# Patient Record
Sex: Female | Born: 1935 | Race: White | Hispanic: No | Smoking: Never smoker
Health system: Southern US, Community
[De-identification: ages and names within clinical notes are randomized; demographics above are authoritative.]

## PROBLEM LIST (undated history)

## (undated) DIAGNOSIS — K219 Gastro-esophageal reflux disease without esophagitis: Secondary | ICD-10-CM

## (undated) DIAGNOSIS — J189 Pneumonia, unspecified organism: Secondary | ICD-10-CM

## (undated) DIAGNOSIS — R32 Unspecified urinary incontinence: Secondary | ICD-10-CM

## (undated) DIAGNOSIS — Z87442 Personal history of urinary calculi: Secondary | ICD-10-CM

## (undated) DIAGNOSIS — E079 Disorder of thyroid, unspecified: Secondary | ICD-10-CM

## (undated) DIAGNOSIS — I219 Acute myocardial infarction, unspecified: Secondary | ICD-10-CM

## (undated) DIAGNOSIS — I4891 Unspecified atrial fibrillation: Secondary | ICD-10-CM

## (undated) DIAGNOSIS — I214 Non-ST elevation (NSTEMI) myocardial infarction: Secondary | ICD-10-CM

## (undated) DIAGNOSIS — I10 Essential (primary) hypertension: Secondary | ICD-10-CM

## (undated) DIAGNOSIS — E785 Hyperlipidemia, unspecified: Secondary | ICD-10-CM

## (undated) DIAGNOSIS — M1990 Unspecified osteoarthritis, unspecified site: Secondary | ICD-10-CM

## (undated) DIAGNOSIS — K449 Diaphragmatic hernia without obstruction or gangrene: Secondary | ICD-10-CM

## (undated) HISTORY — DX: Diaphragmatic hernia without obstruction or gangrene: K44.9

## (undated) HISTORY — DX: Essential (primary) hypertension: I10

## (undated) HISTORY — PX: ESOPHAGOGASTRODUODENOSCOPY (EGD) WITH ESOPHAGEAL DILATION: SHX5812

## (undated) HISTORY — PX: ANKLE FRACTURE SURGERY: SHX122

## (undated) HISTORY — DX: Hyperlipidemia, unspecified: E78.5

## (undated) HISTORY — PX: UPPER GASTROINTESTINAL ENDOSCOPY: SHX188

## (undated) HISTORY — PX: JOINT REPLACEMENT: SHX530

## (undated) HISTORY — DX: Disorder of thyroid, unspecified: E07.9

## (undated) HISTORY — PX: TIBIA FRACTURE SURGERY: SHX806

## (undated) HISTORY — DX: Gastro-esophageal reflux disease without esophagitis: K21.9

## (undated) HISTORY — PX: FRACTURE SURGERY: SHX138

## (undated) HISTORY — PX: CATARACT EXTRACTION, BILATERAL: SHX1313

## (undated) HISTORY — DX: Unspecified osteoarthritis, unspecified site: M19.90

## (undated) HISTORY — PX: KIDNEY STONE SURGERY: SHX686

## (undated) HISTORY — PX: TOTAL HIP ARTHROPLASTY: SHX124

## (undated) HISTORY — PX: APPENDECTOMY: SHX54

## (undated) HISTORY — PX: TONSILLECTOMY: SUR1361

## (undated) HISTORY — PX: CHOLECYSTECTOMY: SHX55

---

## 2003-10-14 ENCOUNTER — Emergency Department (HOSPITAL_COMMUNITY): Admission: EM | Admit: 2003-10-14 | Discharge: 2003-10-14 | Payer: Self-pay | Admitting: Emergency Medicine

## 2006-08-25 ENCOUNTER — Ambulatory Visit: Payer: Self-pay | Admitting: Internal Medicine

## 2006-08-31 ENCOUNTER — Encounter: Admission: RE | Admit: 2006-08-31 | Discharge: 2006-08-31 | Payer: Self-pay | Admitting: Orthopedic Surgery

## 2006-09-08 ENCOUNTER — Inpatient Hospital Stay (HOSPITAL_COMMUNITY): Admission: RE | Admit: 2006-09-08 | Discharge: 2006-09-13 | Payer: Self-pay | Admitting: Orthopedic Surgery

## 2007-01-05 ENCOUNTER — Encounter: Admission: RE | Admit: 2007-01-05 | Discharge: 2007-01-05 | Payer: Self-pay | Admitting: General Surgery

## 2007-01-11 ENCOUNTER — Ambulatory Visit: Payer: Self-pay | Admitting: Internal Medicine

## 2007-01-18 ENCOUNTER — Ambulatory Visit: Payer: Self-pay | Admitting: Internal Medicine

## 2007-01-18 HISTORY — PX: COLONOSCOPY: SHX5424

## 2007-01-18 LAB — CONVERTED CEMR LAB
Basophils Absolute: 0.1 10*3/uL (ref 0.0–0.1)
Basophils Relative: 0.9 % (ref 0.0–1.0)
Eosinophils Absolute: 0 10*3/uL (ref 0.0–0.6)
Eosinophils Relative: 0.5 % (ref 0.0–5.0)
HCT: 37.1 % (ref 36.0–46.0)
Hemoglobin: 12.6 g/dL (ref 12.0–15.0)
Lymphocytes Relative: 25.7 % (ref 12.0–46.0)
MCHC: 34 g/dL (ref 30.0–36.0)
MCV: 83.3 fL (ref 78.0–100.0)
Monocytes Absolute: 0.4 10*3/uL (ref 0.2–0.7)
Monocytes Relative: 7.8 % (ref 3.0–11.0)
Neutro Abs: 3.7 10*3/uL (ref 1.4–7.7)
Neutrophils Relative %: 65.1 % (ref 43.0–77.0)
Platelets: 317 10*3/uL (ref 150–400)
RBC: 4.45 M/uL (ref 3.87–5.11)
RDW: 14 % (ref 11.5–14.6)
WBC: 5.6 10*3/uL (ref 4.5–10.5)

## 2007-03-16 ENCOUNTER — Ambulatory Visit (HOSPITAL_COMMUNITY): Admission: RE | Admit: 2007-03-16 | Discharge: 2007-03-17 | Payer: Self-pay | Admitting: General Surgery

## 2007-09-22 ENCOUNTER — Emergency Department (HOSPITAL_COMMUNITY): Admission: EM | Admit: 2007-09-22 | Discharge: 2007-09-23 | Payer: Self-pay | Admitting: Emergency Medicine

## 2010-01-18 ENCOUNTER — Ambulatory Visit: Payer: Self-pay | Admitting: Diagnostic Radiology

## 2010-01-18 ENCOUNTER — Emergency Department (HOSPITAL_BASED_OUTPATIENT_CLINIC_OR_DEPARTMENT_OTHER): Admission: EM | Admit: 2010-01-18 | Discharge: 2010-01-18 | Payer: Self-pay | Admitting: Emergency Medicine

## 2010-03-06 ENCOUNTER — Telehealth: Payer: Self-pay | Admitting: Internal Medicine

## 2010-03-07 ENCOUNTER — Ambulatory Visit: Payer: Self-pay | Admitting: Internal Medicine

## 2010-03-07 DIAGNOSIS — E785 Hyperlipidemia, unspecified: Secondary | ICD-10-CM

## 2010-03-07 DIAGNOSIS — I10 Essential (primary) hypertension: Secondary | ICD-10-CM

## 2010-03-12 ENCOUNTER — Ambulatory Visit: Payer: Self-pay | Admitting: Internal Medicine

## 2011-01-06 NOTE — Progress Notes (Signed)
Summary: TRIAGE--Dysphagia  Phone Note From Other Clinic Call back at [phone]   Caller: Patient Caller: Verlon Au, nurse   (ask for Debbie) Call For: Dr. Leone Payor Reason for Call: Schedule Patient Appt Summary of Call: pt having severe dysphagia... Initial call taken by: Vallarie Mare,  March 06, 2010 12:10 PM  Follow-up for Phone Call        Pt. will see Willette Cluster NP on 03-07-10 at 2pm. Eunice Blase will advise pt. of appt/med.[email or web address]. She will fax records to [phone]. Follow-up by: Laureen Ochs LPN,  March 06, 2010 12:18 PM

## 2011-01-06 NOTE — Procedures (Signed)
Summary: [facility] COLON   Colonoscopy  Procedure date:  01/18/2007  Findings:      Location:  [facility].   Patient Name: [name], [name]. MRN:  Procedure Procedures: Colonoscopy CPT: [phone].  Personnel: Endoscopist: Iva Boop, MD, [facility].  Exam Location: Exam performed in Outpatient Clinic. Outpatient  Patient Consent: Procedure, Alternatives, Risks and Benefits discussed, consent obtained, from patient. Consent was obtained by the RN.  Indications  Evaluation of: Anemia Positive fecal occult blood test  History  Current Medications: Patient is not currently taking Coumadin.  Allergies: No known allergies.  Pre-Exam Physical: Performed Jan 18, 2007. Cardio-pulmonary exam, Rectal exam, HEENT exam , Abdominal exam, Mental status exam WNL.  Comments: Pt. history reviewed/updated, physical exam performed prior to initiation of sedation? YES Exam Exam: Extent of exam reached: Cecum, extent intended: Cecum.  The cecum was identified by appendiceal orifice and IC valve. Patient position: on left side. Time to Cecum: 00:12:50. Time for Withdrawl: 00:09:48. Colon retroflexion performed. Images taken. ASA Classification: II. Tolerance: good.  Monitoring: Pulse and BP monitoring, Oximetry used. Supplemental O2 given.  Colon Prep Used MoviPrep for colon prep. Prep results: excellent.  Sedation Meds: Patient assessed and found to be appropriate for moderate (conscious) sedation. Residual sedation present from prior procedure today.  Versed 2 mg. given IV.  Findings - NORMAL EXAM: Cecum to Rectum. Comments: tortuous and redundant, skin tags in rectum.   Assessment  Comments: 1) NO POLYPS OR CANCER 2) NORMAL BUT TORTUOUS AND REDUNDANT COLON. NO SOURCE OF ANEMIA SEEN. 3) EXCELLENT PREP 4) HGB TODAY 12.6 WITH NORMAL MCV AND RDW Events  Unplanned Interventions: No intervention was required.  Plans Medication Plan: Continue current medications.  Patient  Education: Patient given standard instructions for: a normal exam.  Disposition: After procedure patient sent to recovery. After recovery patient sent home.  Scheduling/Referral: Follow-Up prn.  Comments: RETURN IF PERSISTENT DYSPHAGIA  cc: Consuello Bossier, MD     Billey Gosling Records, MD  This report was created from the original endoscopy report, which was reviewed and signed by the above listed endoscopist.

## 2011-01-06 NOTE — Miscellaneous (Signed)
Summary: lansoprazole rx  Clinical Lists Changes  Medications: Changed medication from PRILOSEC OTC 20 MG TBEC (OMEPRAZOLE MAGNESIUM) once daily to LANSOPRAZOLE 30 MG CPDR (LANSOPRAZOLE) 1 by mouth 30-60 minutes before breakfast for esophageal stricture - Signed Rx of LANSOPRAZOLE 30 MG CPDR (LANSOPRAZOLE) 1 by mouth 30-60 minutes before breakfast for esophageal stricture;  #30 x 11;  Signed;  Entered by: Iva Boop MD, [facility] Graham;  Authorized by: Iva Boop MD, [facility];  Method used: Electronically to [facility]*, [address] [place], [place]  [address], Ph: [phone], Fax: [phone]    Prescriptions: LANSOPRAZOLE 30 MG CPDR (LANSOPRAZOLE) 1 by mouth 30-60 minutes before breakfast for esophageal stricture  #30 x 11   Entered and Authorized by:   Iva Boop MD, [facility]   Signed by:   Iva Boop MD, [facility] on 03/12/2010   Method used:   Electronically to        [facility]* (retail)       [address] [place], [place]  [address]       Ph: [phone]       Fax: [phone]   RxID:   [id number]

## 2011-01-06 NOTE — Procedures (Signed)
Summary: [facility] EGD   EGD  Procedure date:  01/18/2007  Findings:      Location: [facility]   Patient Name: [name], [name]. MRN:  Procedure Procedures: Panendoscopy (EGD) CPT: 43235.    with [facility] Dilation of Esophagus Personnel: Endoscopist: Iva Boop, MD, [facility].  Exam Location: Exam performed in Outpatient Clinic. Outpatient  Patient Consent: Procedure, Alternatives, Risks and Benefits discussed, consent obtained, from patient. Consent was obtained by the RN.  Indications  Evaluation of: Anemia,   Symptoms: Dysphagia.  History  Current Medications: Patient is not currently taking Coumadin.  Allergies: No known allergies.  Pre-Exam Physical: Performed Jan 18, 2007  Cardio-pulmonary exam, HEENT exam, Abdominal exam, Mental status exam WNL.  Comments: Pt. history reviewed/updated, physical exam performed prior to initiation of sedation? YES Exam Exam Info: Maximum depth of insertion Duodenum, intended Duodenum. Patient position: on left side. Gastric retroflexion performed. Images taken. ASA Classification: II. Tolerance: excellent.  Sedation Meds: Patient assessed and found to be appropriate for moderate (conscious) sedation. Fentanyl 25 mcg. given IV. Versed 3 mg. given IV. Cetacaine Spray 2 sprays given aerosolized.  Monitoring: BP and pulse monitoring done. Oximetry used. Supplemental O2 given  Findings - Normal: Proximal Esophagus to Distal Esophagus.  STRICTURE / STENOSIS: Stricture in Distal Esophagus.  Constriction: partial. Etiology: benign due to reflux. Lumen diameter is 16 mm. ICD9: Esophageal Stricture: 530.3.  - Dilation: Distal Esophagus. for esophageal stricture. Maloney dilator used, Diameter: 54 F, Minimal Resistance, No Heme present on extraction. 1  total dilators used. Patient tolerance excellent.  HIATAL HERNIA: 3 cms. in length. ICD9: Hernia, Hiatal: 553.3. Normal: Cardia to Body.  - Normal: Duodenal Bulb to Duodenal  2nd Portion.  - MUCOSAL ABNORMALITY: Body to Antrum. Erythematous mucosa. Mottled mucosa. ICD9: Gastritis, Unspecified: 535.50.   Assessment  Diagnoses: 530.3: Esophageal Stricture.  553.3: Hernia, Hiatal.  535.50: Gastritis, Unspecified.   Comments: 1) BENIGN-APPEARING DISTAL ESOPHAGEAL STRICTURE DILATED TO 54 FR 2) SMALL HIATAL HERNIA 3) ANTRAL GASTRITIS Events  Unplanned Intervention: No unplanned interventions were required.  Plans Instructions: Clear or full liquids: UNTIL 5 PM THEN SOFT. Resume previous diet: TOMORROW.  Medication(s): Continue current medications.  Patient Education: Patient given standard instructions for: Stenosis / Stricture.  Disposition: After procedure patient sent to recovery. After recovery patient sent home.  Scheduling: Colonoscopy, NEXT    cc: Consuello Bossier, MD     Leonette Most Record, MD  This report was created from the original endoscopy report, which was reviewed and signed by the above listed endoscopist.

## 2011-01-06 NOTE — Letter (Signed)
Summary: EGD Instructions  [facility]  [address] [place], [place] [address]   Phone: [phone]  Fax: [phone]       [name]    01/30/1920    MRN: [id number]       Procedure Day /Date:03-12-10     Arrival Time: 1:30 PM      Procedure Time: 2:30 PM     Location of Procedure:                    X     [facility] (4th Floor)    PREPARATION FOR ENDOSCOPY   On 4-6-11THE DAY OF THE PROCEDURE:  1.   No solid foods, milk or milk products are allowed after midnight the night before your procedure.  2.   Do not drink anything colored red or purple.  Avoid juices with pulp.  No orange juice.  3.  You may drink clear liquids until as:12:30 PM , which is 2 hours before your procedure.                                                                                                CLEAR LIQUIDS INCLUDE: Water Jello Ice Popsicles Tea (sugar ok, no milk/cream) Powdered fruit flavored drinks Coffee (sugar ok, no milk/cream) Gatorade Juice: apple, white grape, white cranberry  Lemonade Clear bullion, consomm, broth Carbonated beverages (any kind) Strained chicken noodle soup Hard Candy   MEDICATION INSTRUCTIONS  Unless otherwise instructed, you should take regular prescription medications with a small sip of water as early as possible the morning of your procedure.       OTHER INSTRUCTIONS  You will need a responsible adult at least 75 years of age to accompany you and drive you home.   This person must remain in the waiting room during your procedure.  Wear loose fitting clothing that is easily removed.  Leave jewelry and other valuables at home.  However, you may wish to bring a book to read or an iPod/MP3 player to listen to music as you wait for your procedure to start.  Remove all body piercing jewelry and leave at home.  Total time from sign-in until discharge is approximately 2-3 hours.  You should go home directly after your  procedure and rest.  You can resume normal activities the day after your procedure.  The day of your procedure you should not:   Drive   Make legal decisions   Operate machinery   Drink alcohol   Return to work  You will receive specific instructions about eating, activities and medications before you leave.    The above instructions have been reviewed and explained to me by   _______________________    I fully understand and can verbalize these instructions _____________________________ Date _________

## 2011-01-06 NOTE — Procedures (Signed)
Summary: Upper Endoscopy w/DIL  Patient: [name] Note: All result statuses are Final unless otherwise noted.  Tests: (1) Upper Endoscopy w/DIL (UED)  UED Upper Endoscopy w/DIL                             DONE     [facility]     [address]     [place], [place]  [address]           ENDOSCOPY PROCEDURE REPORT           PATIENT:  [name], [name]  MR#:  [id number]     BIRTHDATE:  26-May-1920, 89 yrs. old  GENDER:  female           ENDOSCOPIST:  Iva Boop, MD, [facility]           PROCEDURE DATE:  03/12/2010     PROCEDURE:  EGD, diagnostic     ASA CLASS:  Class II     INDICATIONS:  1) dilation of esophageal stricture  2) dysphagia           MEDICATIONS:   Fentanyl 25 mcg IV, Versed 2 mg IV     TOPICAL ANESTHETIC:  Exactacain Spray           DESCRIPTION OF PROCEDURE:   After the risks benefits and     alternatives of the procedure were thoroughly explained, informed     consent was obtained.  The LB GIF-H180 [id number] endoscope was     introduced through the mouth and advanced to the second portion of     the duodenum, without limitations.  The instrument was slowly     withdrawn as the mucosa was carefully examined.     <<PROCEDUREIMAGES>>           A stricture was found in the distal esophagus. 15-16 mm diameter     ring-like stricture without inflammation  A hiatal hernia was     found. It was 3 cm in size.  The examination was otherwise normal.     Dilation was then performed at the distal esophagus           1) Dilator:  Elease Hashimoto  Size(s):  54 French     Resistance:  minimal  Heme:  none           COMPLICATIONS:  None           ENDOSCOPIC IMPRESSION:     1) Stricture in the distal esophagus - dilated to 54 French     2) 3 cm hiatal hernia     3) Otherwise normal examination.     RECOMMENDATIONS:     1) Clear liquids until 4 PM then soft foods. Try normal foods     tomorrow.     2) Start Lansoprazole 30 mg each AM before breakfast, prescription     sent to  pharmacy - THIS REPLACES PRILOSEC OTC           REPEAT EXAM:  In for as needed.           Iva Boop, MD, [facility] Graham           CC:  Leonette Most Record, M.D.     The Patient           n.     eSIGNED:   Iva Boop at 03/12/2010 02:50 PM           [place],  [place], [id number]  Note: An exclamation mark (!) indicates a result that was not dispersed into the flowsheet. Document Creation Date: 03/12/2010 2:51 PM _______________________________________________________________________  (1) Order result status: Final Collection or observation date-time: 03/12/2010 14:32 Requested date-time:  Receipt date-time:  Reported date-time:  Referring Physician:   Ordering Physician: Stan Head [phone]) Specimen Source:  Source: Launa Grill Order Number: [id number] Lab site:

## 2011-01-06 NOTE — Assessment & Plan Note (Signed)
Summary: WORSENING DYSPHAGIA        (DR.GESSNER PT.)    [name]   History of Present Illness Visit Type: Initial Consult Primary GI MD: Stan Head MD [facility] Primary Provider: Leonette Most Record, MD Requesting Provider: Leonette Most Record, MD Chief Complaint: Dysphagia x 2 weeks, History of Present Illness:   Patient last seen by [place] in Feb. 2008 at which time she had a colonoscoopy done for anemia.  Patient  has a history of esophageal stricture / dilation in 2007.  Did well for last few years until 3-4 weeks ago began having problems with swallowing meat. Beef most  problematic. Describes dysphagia as choking sensation immediately after swallowing beef. Feels like she then cannot catch her breath. No problems with liquids. No heartburn but gets occasional reflux.   Takes Aleve 1-3 tablets a weeks.    GI Review of Systems    Reports acid reflux, dysphagia with solids, and  nausea.      Denies abdominal pain, belching, bloating, chest pain, dysphagia with liquids, heartburn, loss of appetite, vomiting, vomiting blood, weight loss, and  weight gain.        Denies anal fissure, black tarry stools, change in bowel habit, constipation, diarrhea, diverticulosis, fecal incontinence, heme positive stool, hemorrhoids, irritable bowel syndrome, jaundice, light color stool, liver problems, rectal bleeding, and  rectal pain. Preventive Screening-Counseling & Management  Alcohol-Tobacco     Smoking Status: never      Drug Use:  no.      Current Medications (verified): 1)  Oxybutynin Chloride 5 Mg Tabs (Oxybutynin Chloride) .... Take 1 Tablet By Mouth Two Times A Day As Needed For Bladder Control 2)  Lisinopril 10 Mg Tabs (Lisinopril) .... Take 2 Tablets By Mouth  Every Morning & 1 Tablet Every Evening For Blood Preesure 3)  Amlodipine Besylate 2.5 Mg Tabs (Amlodipine Besylate) .... Take 1 Tablet By Mouth Once Daily 4)  Lipitor 10 Mg Tabs (Atorvastatin Calcium) .... Once Daily 5)  Prilosec Otc 20 Mg Tbec  (Omeprazole Magnesium) .... Once Daily 6)  Aspir-Low 81 Mg Tbec (Aspirin) .... Once Daily 7)  Calcium 600 1500 Mg Tabs (Calcium Carbonate) .... Once Daily 8)  Glucosamine-Chondroitin 500-400 Mg Caps (Glucosamine-Chondroitin) .... Once Daily  Allergies (verified): No Known Drug Allergies  Past History:  Past Medical History: Hypertension Esophageal Stricture Hyperlipidemia GERD  Past Surgical History: Cataract Extraction Cholecystectomy Hip Replacement Tonsillectomy  Family History: No FH of Colon Cancer: Family History of Diabetes: Brother Family History of Heart Disease: Father  Social History: Occupation: Retired Patient has never smoked.  Alcohol Use - no Daily Caffeine Use Illicit Drug Use - no Smoking Status:  never Drug Use:  no  Review of Systems       The patient complains of arthritis/joint pain, cough, hearing problems, muscle pains/cramps, urine leakage, and voice change.  The patient denies allergy/sinus, anemia, anxiety-new, back pain, blood in urine, breast changes/lumps, change in vision, confusion, coughing up blood, depression-new, fainting, fatigue, fever, headaches-new, heart rhythm changes, itching, menstrual pain, night sweats, nosebleeds, pregnancy symptoms, shortness of breath, skin rash, sleeping problems, sore throat, swelling of feet/legs, swollen lymph glands, thirst - excessive , urination - excessive , urination changes/pain, and vision changes.    Vital Signs:  Patient profile:   77 year old female Height:      65 inches Weight:      146 pounds Pulse rate:   64 / minute Pulse rhythm:   regular BP sitting:   150 / 60  (  left arm) Cuff size:   regular  Vitals Entered By: June McMurray CMA Duncan Dull) (March 07, 2010 1:24 PM)  Physical Exam  General:  Well developed, well nourished, no acute distress. Head:  Normocephalic and atraumatic. Eyes:  Conjunctiva pink, no icterus.  Mouth:  No oral lesions. Tongue moist.  Neck:  no obvious  masses  Lungs:  Clear throughout to auscultation. Heart:  Regular rate and rhythm; no murmurs, rubs,  or bruits. Abdomen:  Abdomen soft, nontender, nondistended. No obvious masses or hepatomegaly.Normal bowel sounds.  Msk:  Symmetrical with no gross deformities. Normal posture. Extremities:  No palmar erythema, no edema.  Neurologic:  Alert and  oriented x4;  grossly normal neurologically. Skin:  Intact without significant lesions or rashes. Cervical Nodes:  No significant cervical adenopathy. Psych:  Alert and cooperative. Normal mood and affect.   Impression & Recommendations:  Problem # 1:  DYSPHAGIA (VHQ-469.62) Assessment Deteriorated Soild food dysphagia, specifically with beef. History of distal esophageal stricture dilated three years ago. Rule out recurrent stricture. The patient will be scheduled for an EGD with biopsies/ esophageal dilation ( if indicated).  The risks and benefits of the procedure, as well as alternatives were discussed with the patient and she agrees to proceed. We discussed that in the meantime she should avoid beef, eat small bites and drink lots of fluid with meals.   Orders: EGD SAV (EGD SAV)  Problem # 2:  HYPERTENSION (ICD-401.9) Assessment: Comment Only Treated  Problem # 3:  HYPERLIPIDEMIA (ICD-272.4) Assessment: Comment Only Treated.  Patient Instructions: 1)  We have scheduled the Endoscopy with Dr. Leone Payor on 03-12-10. 2)  Endoscopy brochure given. 3)  Stop the Aleve Sat 03-08-10 until after the Endoscopy.  4)  Copy sent to : Leonette Most Record, MD 5)  The medication list was reviewed and reconciled.  All changed / newly prescribed medications were explained.  A complete medication list was provided to the patient / caregiver.

## 2011-02-26 LAB — URINALYSIS, ROUTINE W REFLEX MICROSCOPIC
Bilirubin Urine: NEGATIVE
Glucose, UA: NEGATIVE mg/dL
Ketones, ur: NEGATIVE mg/dL
Leukocytes, UA: NEGATIVE
Nitrite: NEGATIVE
Protein, ur: NEGATIVE mg/dL
Specific Gravity, Urine: 1.014 (ref 1.005–1.030)
Urobilinogen, UA: 0.2 mg/dL (ref 0.0–1.0)
pH: 7 (ref 5.0–8.0)

## 2011-02-26 LAB — COMPREHENSIVE METABOLIC PANEL
ALT: 17 U/L (ref 0–35)
AST: 22 U/L (ref 0–37)
Albumin: 3.7 g/dL (ref 3.5–5.2)
Alkaline Phosphatase: 72 U/L (ref 39–117)
BUN: 14 mg/dL (ref 6–23)
CO2: 30 mEq/L (ref 19–32)
Calcium: 9.3 mg/dL (ref 8.4–10.5)
Chloride: 106 mEq/L (ref 96–112)
Creatinine, Ser: 0.9 mg/dL (ref 0.4–1.2)
GFR calc Af Amer: >60 mL/min (ref >60)
GFR calc non Af Amer: 59 mL/min — ABNORMAL LOW (ref >60)
Glucose, Bld: 90 mg/dL (ref 70–99)
Potassium: 4.3 mEq/L (ref 3.5–5.1)
Sodium: 141 mEq/L (ref 135–145)
Total Bilirubin: 0.5 mg/dL (ref 0.3–1.2)
Total Protein: 6.8 g/dL (ref 6.0–8.3)

## 2011-02-26 LAB — URINE MICROSCOPIC-ADD ON

## 2011-02-26 LAB — DIFFERENTIAL
Basophils Absolute: 0 10*3/uL (ref 0.0–0.1)
Basophils Relative: 0 % (ref 0–1)
Eosinophils Absolute: 0.1 10*3/uL (ref 0.0–0.7)
Eosinophils Relative: 2 % (ref 0–5)
Lymphocytes Relative: 20 % (ref 12–46)
Lymphs Abs: 1.5 10*3/uL (ref 0.7–4.0)
Monocytes Absolute: 0.5 10*3/uL (ref 0.1–1.0)
Monocytes Relative: 6 % (ref 3–12)
Neutro Abs: 5.3 10*3/uL (ref 1.7–7.7)
Neutrophils Relative %: 71 % (ref 43–77)

## 2011-02-26 LAB — URINE CULTURE: Colony Count: 10000

## 2011-02-26 LAB — CBC
HCT: 38.1 % (ref 36.0–46.0)
Hemoglobin: 13.1 g/dL (ref 12.0–15.0)
MCHC: 34.5 g/dL (ref 30.0–36.0)
MCV: 87.4 fL (ref 78.0–100.0)
Platelets: 278 10*3/uL (ref 150–400)
RBC: 4.35 MIL/uL (ref 3.87–5.11)
RDW: 13 % (ref 11.5–15.5)
WBC: 7.4 10*3/uL (ref 4.0–10.5)

## 2011-02-26 LAB — LIPASE, BLOOD: Lipase: 63 U/L (ref 23–300)

## 2011-04-24 NOTE — Op Note (Signed)
[name], [name]                 ACCOUNT NO.:  [id number]   MEDICAL RECORD NO.:  [id number]          PATIENT TYPE:  INP   LOCATION:  0009                         FAC[facility]   PHYSICIAN:  Ollen Gross, M.D.    DATE OF BIRTH:  1920/09/05   DATE OF PROCEDURE:  09/08/2006  DATE OF DISCHARGE:                                 OPERATIVE REPORT   PREOPERATIVE DIAGNOSIS:  Osteoarthritis, right hip.   POSTOPERATIVE DIAGNOSIS:  Osteoarthritis, right hip.   PROCEDURE:  Right total hip arthroplasty.   SURGEON:  Ollen Gross, M.D.   ASSISTANT:  Avel Peace, M.D.   ANESTHESIA:  General.   ESTIMATED BLOOD LOSS:  350.   DRAINS:  Hemovac x1.   COMPLICATIONS:  None.   CONDITION:  Stable to recovery room.   CLINICAL NOTE:  [name] is an 77 year old female with severe right hip  pain, inability to walk with a rapidly progressive onset.  Radiographs show  a collapsed femoral head with severe end-stage osteoarthritis.  We have been  treating the knee arthritis for long time at the hip. It became acutely  painful and she had intractable pain.  She presents now for total hip  arthroplasty.   PROCEDURE IN DETAIL:  After successful administration of a general  anesthetic, the patient was placed in the left lateral decubitus position  with the right side up and held with the hip positioner.  The right lower  extremity is isolated from her perineum with plastic drapes and prepped and  draped in the usual sterile fashion.  A short posterolateral incision is  made with a 10 blade through the subcutaneous tissue to the level of the  fascia lata which is incised in line with the skin incision.  The sciatic  nerve is palpated and protected and the short rotators isolated off the  femur.  Capsulectomy was performed and the hip is dislocated.  The center of  the femoral head is marked and the trial prosthesis placed such that the  center of the trial head corresponds to the center of her  native femoral  head.  Osteotomy lines were marked on the femoral neck and osteotomy made  with an oscillating saw.  Femoral head is removed and then the femur  retracted anteriorly to gain acetabular exposure.   Acetabular reaming begins at 45 coursing in increments of 2 up to 53 mm and  then a 54 mm pinnacle acetabular shell is placed in anatomic position and  transfixed with two dome screws.  Trial 32 mm neutral +4 liner was placed.   The femur was prepared with the canal finder and irrigation.  Axial reaming  is performed to 13.5 mm, proximal reaming to 18 D and the sleeve machine to  a small.  An 43 D small trial sleeve is placed at 18 x 13 stem and a 36 plus  8 neck.  Her native anteversion is neutral so I dialed in 20 degrees of  anteversion.  I placed the 32 +0 head.  There was not enough offset to a 36  plus 12 neck.  Again we used 20 degrees of anteversion.  I tried the 32 +0  head first, but needed additional head size, so I went with the 32 plus 6.  The hips were reduced with outstanding stability, full extension, full  external rotation, 70 degrees flexion, 40 degrees adduction, 90 degrees  internal rotation and 90 degrees of flexion, 70 degrees of internal  rotation.  Cleared by placing the right leg on top of the left.  The leg  lengths were now equal.  The hip was then dislocated and all trials were  removed.  The permanent apex hole eliminator is placed into the acetabular  shell and then a permanent 32 mm neutral +4 liner was placed.  This is a  Museum/gallery curator.  On the femoral side we placed the 18 D small sleeve with  the 18 x 13 stem and a 36 plus 12 neck again 20 degrees beyond her native  anteversion.  The 32 plus 6 head is placed and the hip is reduced with the  same stability parameters.  The wound was copiously irrigated with saline  solution and the short rotators were reattached to the femur through drill  holes.  The fascia lata was closed over a Hemovac drain  with interrupted #1  Vicryl, the subcu closed with #1 and #2-0 Vicryl and subcuticular running 4-  0 Monocryl.  The incisions were cleaned and dried and Steri-Strips and a  bulky sterile dressing applied.  A drain is hooked to suction and she is  placed into a knee immobilizer, awakened, and transferred to recovery in  stable condition.      Ollen Gross, M.D.  Electronically Signed     FA/MEDQ  D:  09/08/2006  T:  09/10/2006  Job:  [id number]

## 2011-04-24 NOTE — Assessment & Plan Note (Signed)
HEALTHCARE                         GASTROENTEROLOGY OFFICE NOTE   NAME:Keatts, CHYLA SCHLENDER                        MRN:          [id number]  DATE:01/11/2007                            DOB:          December 22, 1919    CHIEF COMPLAINT:  Followup of dysphagia, anemia.   HISTORY:  Ms. Deford had her hip replaced after I saw her in the fall.  She was having a lot of early satiety and weight loss.  She was also  anemic with a heme-positive stool.  After her hip was replaced, she is  much better but she still has some early satiety and what sounds like  possible dysphagia.  She has had 2 previous dilations in [facility].  In addition, she feels hoarse a lot of times.  In the morning, she has a  phlegm that she needs to clear out of her throat.  She clearly indicates  she still gets filled fast and has some anorexia but thinks that is  better.  She is on Prilosec 40 mg twice daily.  Since I saw her, she  also developed problems with her right inguinal hernia.  Dr. Zachery Dakins  has evaluated her.  CT scan of the abdomen and pelvis on January 05, 2007, demonstrated a small, right inguinal hernia containing fat in the  portion of the appendix tip.  She had degenerative disk disease in the L-  spine.  She is feeling better.  She has gained 6 pounds since she had  her hip replaced by Dr. Lequita Halt.  She did have some blood loss anemia  associated with her hip as well and her hemoglobin fell to 8 at one  point.  She did receive transfusions in the hospital.   PAST MEDICAL HISTORY:  1. Right hip replacement.  2. Hypertension.  3. Bilateral knee osteoarthritis.  4. Lumbar spine degeneration.  5. Bladder dysfunction.  6. Dyslipidemia.  7. Hypothyroidism.  8. Appendectomy.  9. Cholecystectomy.   MEDICATIONS:  1. Lipitor 10 mg daily.  2. Oxybutynin 5 mg twice daily and 2 at bedtime.  3. Labetalol 50 mg twice daily.  4. Lisinopril 5 mg daily.  5. Aspirin 81 mg daily.  6. Ferrous sulfate daily.  7. Prilosec 40 mg twice daily.  8. Hydrocodone p.r.n.   DRUG ALLERGIES:  None known.   REVIEW OF SYSTEMS:  CONSTITUTIONAL:  Negative.  EYES:  Negative.  CARDIOVASCULAR:  Negative.  RESPIRATORY:  Negative.  ADDITIONAL GI:  She  has had more frequent bowel movements with no bleeding noted.   PHYSICAL EXAM:  Reveals an elderly, robust woman.  Weight 130 pounds.  Pulse is 68.  Blood pressure 120/70.  Height 5 feet  3 inches.  EYES:  Anicteric.  ABDOMEN:  Soft.  There is a small right inguinal hernia, easily  reproducible.  Abdomen is nontender without other organomegaly or mass.  NEURO:  She is alert and oriented x3.   ASSESSMENT:  1. Dysphagia, early satiety, history of weight loss that is improving.      Known history of esophageal stricture or at least esophageal  dilation.  2. Anemia.  Heme-positive stool.  3. Some change in bowel habits with more frequent stools.  4. Right inguinal hernia, this may be repaired by Dr. Zachery Dakins at      some point.   Note:  We will follow up her CBC today as well.   PLAN:  Schedule upper GI endoscopy with possible esophageal dilation.  Schedule colonoscopy.  We have elected to perform these at the same time  i.e. on the same day.  Risks, benefits, and indications are explained.  She understands and agrees to proceed.     Iva Boop, MD,FACG  Electronically Signed    CEG/MedQ  DD: 01/11/2007  DT: 01/11/2007  Job #: [id number]   cc:   Anselm Pancoast. Zachery Dakins, M.D.  Charles Record

## 2011-04-24 NOTE — Op Note (Signed)
NAMEALISSA, [name]                 ACCOUNT NO.:  [id number]   MEDICAL RECORD NO.:  [id number]          PATIENT TYPE:  AMB   LOCATION:  DAY                          FACILITY:  [facility]   PHYSICIAN:  Anselm Pancoast. Weatherly, M.D.DATE OF BIRTH:  Jun 04, 1920   DATE OF PROCEDURE:  03/16/2007  DATE OF DISCHARGE:                               OPERATIVE REPORT   PREOPERATIVE DIAGNOSES:  Right inguinal hernia.   POSTOPERATIVE DIAGNOSIS:  Right inguinal hernia.   OPERATIONS:  Right inguinal herniorrhaphy.   ANESTHESIA:  Local with sedation surgery.   SURGEON:  Anselm Pancoast. Zachery Dakins, M.D.   HISTORY:  [name] is an 77 year old female whom I first saw  approximately 3 months ago when she was in for a fairly recent right hip  replacement that had been done; and the patient, as she was recovering,  had a pain and bulge in the right groin.  This appeared to be anterior;  and she was referred by her medical physician for a right inguinal  hernia.  The patient had a history that was fairly complex, in that she  had lost about 50 pounds prior to hip replacement, was having  significant pain in the right hip area.  She had been seen by Dr.  Leone Payor who had found blood in her stool; and then had been scheduled  for an endoscopy procedure, but because of the pain she was having in  her hip; and the time came that she could get a hip replacement, then  she proceeded in having that done here at [facility] by Dr. Lequita Halt on  October 3.  She was on Coumadin for short period afterwards. She did  well from the surgery, went to stay with her son after she was released  from the rehab; and it was during this period of time that she got the  bulge.  When I saw her in the office; she did not have a definite bulge,  but she certainly gave a history that she had had swelling in the right  groin, not in the femoral area; and she had a lymph node that was  lateral to the inguinal incision area.  Since I could not  feel a  definite hernia, since she had lost weight and had an enlarged lymph  node; we worked her up as follows:  The first thing I did a CT and it  showed the enlarged lymph node in the right inguinal area but not any  periaortic or other areas.  I then talked with Dr. Leone Payor, but by this  time she had been taken off the Coumadin; and we proceeded on having an  upper and lower endoscopy with no evidence of any acute findings,  especially ruling out malignancy at her age of 89.   Since then, there has been a small amount of pain in the groin with  activity, but never actually seeing the bulge any more; and I have seen  her back in the office on two occasions; and feel that this is most  likely kind of a direct inguinal hernia; and  not, at no time, have I  ever felt a bulge in the femoral area on the right.  She is here, today,  for the planned procedure; and I am going to do it as local and  sedation.   Preop she was given a gram of Ancef.  The lymph node that had been  palpable previously, is no longer palpable; and I marked the right side.  I then positioned her on the OR table.  The right pubic hair area was  first clipped and then prepped with Betadine solution and draped in a  sterile manner.  The ilioinguinal nerve was anesthetized with a mixture  of 1/2% plain Xylocaine and 0.25 Marcaine with adrenalin; and then the  ilioinguinal nerve area was infiltrated with a blunted 22-gauge needle.  The patient positions the bulge anteriorly; and I could feel the  inguinal ligament area.  A small incision was made and sharp dissection  down through the skin and subcutaneous tissues.  She is very thin; and  then the external oblique aponeurosis was opened through the external  ring.  I then elevated the external oblique, and could see a little  fatty tissue that was kind of coming out through the internal ring area;  and I was impressed that it was not as big that she had certainly   complained of previously; and we sort of opened up the floor.  She  certainly does have a weakness, but it is the medial to the femoral  vein.  I elected to repair this, by taking the internal oblique  conjoined tendon area; and first bringing it down to Cooper's ligament  medially; and then switching to the shelving edge of the inguinal  ligament right at the femoral vein area; and then basically closing the  internal ring area.   The ilioinguinal nerve had been protected and brought out inferiorly;  and then at first I was wondering about whether to reinforce the area  with mesh, but I do not think that the defect is actually more of a  direct; and her tissues were such, that I think that it will have less  pain if we will go ahead and repair, without using a piece of mesh  between the internal and external oblique area.  As I am closing the  external oblique, laterally, I made sure that little ilioinguinal nerve  was inferior and not trapped in the sutures; and then in the medial  half, I  brought the nerve so it was external to the external oblique;  and then repaired the floor in the direct area, kind of incorporating  the external oblique with the internal oblique; and the conjoined tendon  area.  I had her strain numerous times during this procedure to see if I  could feel any other fascial defects etcetera, and could not.  She was  comfortable throughout the procedure.   The Scarpa's fascia was closed with interrupted 3-0 Vicryl.  I closed  the external oblique with the 2-0 Prolene as I had done the shoulder-  type repair more laterally.  The subcuticular 4-0 Vicryl and then  Benzoin and Steri-Strips on the skin.  The patient tolerated the  procedure nicely.  Hopefully, she will be able to void.  I used about 30  mL of the mixture of anesthetic solutions; and whether she goes home  today or tomorrow, we will let her decide with her age of 77.  ______________________________  Anselm Pancoast. Zachery Dakins, M.D.     WJW/MEDQ  D:  03/16/2007  T:  03/16/2007  Job:  [id number]

## 2011-04-24 NOTE — Discharge Summary (Signed)
[name], [name]                 ACCOUNT NO.:  [id number]   MEDICAL RECORD NO.:  [id number]          PATIENT TYPE:  INP   LOCATION:  1621                         FACILITY:  [facility]   PHYSICIAN:  Ollen Gross, M.D.    DATE OF BIRTH:  [birth year]   DATE OF ADMISSION:  09/08/2006  DATE OF DISCHARGE:  09/13/2006                                 DISCHARGE SUMMARY   ADMISSION DIAGNOSES:  1. Osteoarthritis right hip.  2. Hypertension.  3. Reflux disease.  4. History of esophageal stricture.  5. Urinary incontinence.   DISCHARGE DIAGNOSES:  1. Osteoarthritis right hip status post right total hip arthroplasty.  2. Acute blood loss anemia.  3. Status post transfusion without sequelae.  4. Postoperative hyponatremia improved.  5. Hypertension.  6. Reflux disease.  7. History of esophageal stricture.  8. Urinary incontinence.   PROCEDURE:  September 08, 2006 right total hip surgery by Dr. Lequita Halt assisted  by A. Perkins, P.A.C. Anesthesia general.   CONSULTATIONS:  None.   BRIEF HISTORY:  [name] is an 77 year old female with severe right hip  pain and inability to walk with rapid progression of the onset of arthritis,  collapse at the femoral head, intractable pain, who now presents for a total  hip arthroplasty.   LABORATORY DATA:  Preoperative CBC: hemoglobin 11.3, hematocrit 32.7, white  cell count 8000. Postoperative hemoglobin 9.6 dropped down to 8.1. He was  given blood and post transfusion hemoglobin was 10.7, hematocrit 30.0.  PT,  PTT preoperatively 13.7 and 32 respectively. INR of 1.10. Serial protimes  were followed, PT/INR 22.3 and 1.9. Chemistry panel on admission had a  mildly elevated glucose of 127, low total protein of 5.5, low albumin of  2.7, remaining chemistry panel within normal limits and were followed.  Glucose 127 to 215, back down to 134, sodium dropped to 139 and 131, back up  to 135. Preoperative urinalysis was greenish in color, otherwise negative.  Chemicals may have affected the color. Blood type A negative.   EKG dated September 07, 2006: Sinus bradycardia, left ventricular hypertrophy,  T wave abnormalities, confirmed by Dr. Nanetta Batty. A 2-view chest on  September 07, 2006: Old right rib fractures, no evidence of acute chest disease  radiographically. Hip films pre-op on September 07, 2006: Advanced severe right  hip arthritis. Portable pelvis on September 08, 2006 along with portable right  hip: Status post right total hip, no acute findings.   HOSPITAL COURSE:  The patient was admitted to [facility] and  tolerated the procedure well. Transferred to recovery and started on PCA and  stayed in PACU through the night. Had a so-so night. Later transferred to  the orthopedic floor after the night in recovery. A Hemovac drain was placed  and then was pulled. Started back on Prilosec. Foley was in place. She did  have some elevated serum glucose. Fluids were changed, D5 was removed. Once  she got up to the floor therapy was started. By day 2 she was doing better,  had a little bit better rest after a night in recovery.  Hemoglobin was down  to 9. Serum glucose had improved. Dressing was changed, incision looked  good. Started again on physical therapy. Ambulated about 30 feet then 50  feet that afternoon. Continued to progress well with therapy. By day 3 Foley  had been removed. Labs had gotten a little bit lower but she was  asymptomatic with the low hemoglobin of 8.4. We rechecked it the following  day but it had gotten a little bit lower down to 8.1. She was given 2 units  of packed cells. She felt much better after receiving the blood. Progressed  well. Ambulated approximately 90 feet. Continued to do well and by the  following day her hemoglobin was back up to 10. Felt better after the  transfusion, stronger, and was ready to go home.   DISCHARGE PLAN:  The patient was discharged home on September 13, 2006 with  diagnoses as noted  above.   DISCHARGE MEDICATIONS:  Vicodin, Robaxin, Coumadin and Keflex.   DIET:  Resume home diet.   FOLLOW UP:  In 2 weeks.   ACTIVITY:  Weight bearing as tolerated, total knee protocol. Home Health  nursing.   DISPOSITION:  Home.   CONDITION ON DISCHARGE:  Improved.      Alexzandrew L. Julien Girt, P.A.      Ollen Gross, M.D.  Electronically Signed    ALP/MEDQ  D:  10/07/2006  T:  10/07/2006  Job:  [id number]   cc:   Leonette Most Record  Fax: [phone]   Iva Boop, MD,FACG  [facility]  [address] [place], [place] [address]

## 2011-04-24 NOTE — H&P (Signed)
[name], [name]                 ACCOUNT NO.:  [id number]   MEDICAL RECORD NO.:  [id number]          PATIENT TYPE:  INP   LOCATION:  1621                         FACILITY:  [facility]   PHYSICIAN:  Ollen Gross, M.D.    DATE OF BIRTH:  [birth year]   DATE OF ADMISSION:  09/08/2006  DATE OF DISCHARGE:                                HISTORY & PHYSICAL   CHIEF COMPLAINT:  Right hip pain.   HISTORY OF PRESENT ILLNESS:  Patient is a 77 year old female, has been seen  by Dr. Lequita Halt for ongoing hip pain.  She has been treated in the past for  her knees.  Unfortunately, her right hip has been rapidly progressing in  severity over a short period of time.  She has had a major increase in pain,  especially over the past month.  X-rays taken by her primary care physician  in [place] showed severe arthritis.  She followed up with Dr. Lequita Halt  and found to have endstage arthritis with actually some subchondral collapse  that has caused marked increase in pain.  Due to the significant findings,  it is felt that she would benefit from undergoing surgical intervention.  Risks and benefits have been discussed.  Patient was subsequently admitted  to hospital.   ALLERGIES:  NO KNOWN DRUG ALLERGIES.   CURRENT MEDICATIONS:  Hydrocodone, Labetalol, Lipitor, Oxybutynin, AcipHex  and aspirin.   PAST MEDICAL HISTORY:  Hypertension, history of esophageal strictures,  reflux disease, urinary incontinence.   PAST SURGICAL HISTORY:  Tonsillectomy, cholecystectomy, recent EGD with  esophageal dilatation.   SOCIAL HISTORY:  Widowed, non-smoker, no alcohol, one child.   FAMILY HISTORY:  Father is deceased, age 55, with history of MI.  Mother  deceased, age 77.   REVIEW OF SYSTEMS:  GENERAL:  No fevers, chills, night sweats.  NEURO:  No  seizures, syncope or paralysis.  RESPIRATORY:  No shortness of breath,  productive cough or hemoptysis.  CARDIOVASCULAR:  No chest pain, angina or  orthopnea.  GI:  No  nausea, vomiting, diarrhea, constipation.  GU:  No  dysuria, hematuria, discharge.  MUSCULOSKELETAL:  Right hip.   PHYSICAL EXAMINATION:  VITAL SIGNS:  Pulse 56, respirations 12, blood  pressure 130/50.  GENERAL:  An 77 year old white female, petite frame, well-nourished, well-  developed, in no acute distress.  She is accompanied by her son.  She is  alert, oriented, cooperative, pleasant.  HEENT:  Normocephalic, atraumatic.  Pupils are round, reactive.  Oropharynx  clear.  EOMs intact.  She does have an upper partial denture plate.  CHEST:  Clear anterior, posterior chest walls.  No rhonchi, rales or  wheezing.  HEART:  Regular rate and rhythm.  No murmur, S1, S2.  ABDOMEN:  Soft, flat, nontender, bowel sounds present.  RECTAL, BREAST, GENITALIA:  Not done, not pertinent to present illness.  EXTREMITIES:  Right hip shows flexion of about 90 degrees to 0 internal and  0 external rotation, abduction of about 20 degrees.  Motor function is  intact.   IMPRESSION:  1. Osteoarthritis, right hip.  2. Hypertension.  3. Reflux  disease.  4. History of esophageal stricture.  5. Urinary incontinence.   PLAN:  Patient admitted to [facility] to undergo a right total hip  arthroplasty.  Surgery will be performed by Dr. Ollen Gross.      Alexzandrew L. Julien Girt, P.A.      Ollen Gross, M.D.  Electronically Signed    ALP/MEDQ  D:  09/08/2006  T:  09/09/2006  Job:  [id number]   cc:   Leonette Most Record  Fax: [phone]   Ollen Gross, M.D.  Fax: [phone]   Iva Boop, MD,FACG  [facility]  [address] [place], [place] [address]

## 2011-04-24 NOTE — Assessment & Plan Note (Signed)
[facility]                     [facility] GASTROENTEROLOGY OFFICE NOTE   NAME:[name], [name]                          MRN:          [id number]  DATE:08/25/2006                            DOB:          December 23, 1919    CHIEF COMPLAINT:  Weight loss, abdominal pain, and anemia.   REQUESTING PHYSICIAN:  Charles Record.   ASSESSMENT:  An 77 year old white woman who has had 20+ pound weight loss  over the last several months.  This is associated with anorexia.  She has  some aching in the left lower quadrant with a low-grade temperature to 99.8.  CT scan of the abdomen and pelvis has really been unrevealing except for  right hip problems, which I think is her biggest problem.  She has  excruciating pain due to collapse in the right hip joint.  She is hemoccult  positive today and has a mild anemia with a hemoglobin of 11.6 in August.   She had been on Arthrotec, so she certainly have gastritis or ulcer disease  causing her problems.  Colon cancer is in the differential as well.   RECOMMENDATIONS/PLANS:  1. Proceed to office visit with Dr. Lequita Halt tomorrow.  I think her right      hip is the biggest problem and may be responsible for some of her      anorexia issues, etc.  2. She needs an EGD and possible colonoscopy, depending upon the clinical      course.  She is not really a good candidate for her colonoscopy due to      her comorbidities and problems right now and certainly would be a      higher risk of problems from complications.  An upper GI endoscopy      could be done and may be needed.  3. I have given the patient and her son a note to take to Dr. Lequita Halt for      guidance regarding whether he feels he needs further GI workup prior to      any intervention for her hip.  We will follow up with the patient and      Dr. Despina Hick on this.  A telephone call will be made.   HISTORY:  A pleasant 77 year old white woman with problems as above.  She  has been having some anorexia and nausea.  No vomiting.  Has lost about 20  pounds or so since February. She describes aching in the left lower quadrant  and was prescribed Cipro and Flagyl empirically on August 20, 2006.  She  feels about the same since then.  She cannot give me a clear answer as to  whether Vicodin is helping more than the Arthrotec she was on.  It is very  difficult for her to walk.  She has extreme right hip pain and right lower  quadrant discomfort.  She does not describe diarrhea, rectal bleeding,  hematochezia, or melena.  She has been on iron for a month or so, it sounds  like, and her stools have been dark.   X-rays of the right hip on August 20, 2006  demonstrate significant  progression of advanced degenerative changes there, flattening of the  weightbearing surface, consistent with cortical fracture, and multiple other  changes.  A CT of the abdomen and pelvis has shown her to be status post  cholecystectomy.  She has a left renal cyst, calcified uterine fibroid, some  fecal retention, the degenerative changes in the right hip, and extensive  atherosclerotic disease in the vessels of the abdomen.  There is a history  of dysphagia and what sounds like an esophageal stricture dilated in [facility] about four years ago.  She does not have active dysphagia at this  time.  She had been on Prilosec and was switched to Nexium.   MEDICATIONS:  1. Nexium 40 mg daily.  2. Lipitor 10 mg daily.  3. Oxybutynin 5 mg twice daily and 10 mg at bedtime.  4. Labetalol 50 mg twice daily.  5. Lisinopril 5 mg daily.  6. Aspirin 81 mg daily.  7. Ferrous sulfate 325 mg daily.  8. Hydrocodone/APAP 5/500 t.i.d. p.r.n.  9. Cipro 500 mg b.i.d.  10.Flagyl 500 mg t.i.d.   DRUG ALLERGIES:  None known.   PAST MEDICAL HISTORY:  1. Hypertension.  2. Degenerative changes in the hip.  3. Bilateral knee problems with arthritis, treated by Dr. Despina Hick with      synovial fluid  injections over time.  4. Bladder dysfunction.  5. Dyslipidemia.  6. History of hypothyroidism.  7. Prior appendectomy.  8. Prior cholecystectomy.   FAMILY HISTORY:  Positive for heart disease in a father, diabetes in a  brother.  No colon cancer.   SOCIAL HISTORY:  She is widowed.  She lives alone in [place].  Previously had gotten much of her care in [place], but her son who is here  with her today and other children live in [place], and she is directing  more care there.  No alcohol, tobacco or drugs.   REVIEW OF SYSTEMS:  See my history and physical form for full details.   PHYSICAL EXAMINATION:  GENERAL:  A well-developed elderly white woman  looking somewhat frail.  VITAL SIGNS:  Height 5 feet 4, weight 125 pounds.  Blood pressure 126/68,  pulse 69.  HEENT:  Eyes anicteric.  Normal mouth and pharynx.  NECK:  Supple without mass.  CHEST:  Clear except for a few bibasilar crackles that improved with  respiration.  HEART:  S1 and S2.  No murmurs, rubs or gallops.  No jugular venous  distention.  ABDOMEN:  She is tender in the right lower quadrant without organomegaly or  mass.  RECTAL:  In the presence of female nursing staff, shows hemoccult positive  iron-colored stool.  No mass.  EXTREMITIES:  No edema in the lower extremities.  SKIN:  Some areas of senile purpura.  NEURO:  She is alert and oriented x3.  MUSCULOSKELETAL:  The right hip is tender with any range of motion.  She has  limited range of motion there, needing significant support with a cane or  assistance when she tries to weight bear.   I appreciate the opportunity to care for this patient.                                   Iva Boop, MD,FACG   CEG/MedQ  DD:  08/25/2006  DT:  08/26/2006  Job #:  [id number]   cc:   Leonette Most Record  Ollen Gross,  M.D. 

## 2011-09-16 LAB — CK TOTAL AND CKMB (NOT AT ARMC): Total CK: 69

## 2011-09-16 LAB — I-STAT 8, (EC8 V) (CONVERTED LAB)
Acid-Base Excess: 2
BUN: 14
Bicarbonate: 26.3 — ABNORMAL HIGH
Chloride: 103
Glucose, Bld: 113 — ABNORMAL HIGH
HCT: 40
Hemoglobin: 13.6
Operator id: 282201
Potassium: 4.4
Sodium: 136
TCO2: 28
pCO2, Ven: 39.5 — ABNORMAL LOW
pH, Ven: 7.432 — ABNORMAL HIGH

## 2011-09-16 LAB — URINALYSIS, ROUTINE W REFLEX MICROSCOPIC
Bilirubin Urine: NEGATIVE
Glucose, UA: NEGATIVE
Hgb urine dipstick: NEGATIVE
Ketones, ur: NEGATIVE
Nitrite: NEGATIVE
Protein, ur: NEGATIVE
Specific Gravity, Urine: 1.008
Urobilinogen, UA: 0.2
pH: 7.5

## 2011-09-16 LAB — DIFFERENTIAL
Basophils Absolute: 0
Basophils Relative: 1
Eosinophils Absolute: 0.2
Monocytes Relative: 8
Neutrophils Relative %: 56

## 2011-09-16 LAB — CBC
HCT: 35.7 — ABNORMAL LOW
Hemoglobin: 12.1
MCHC: 33.9
MCV: 85.9
Platelets: 298
RBC: 4.16
RDW: 13.6
WBC: 8

## 2011-09-16 LAB — URINE MICROSCOPIC-ADD ON

## 2011-09-16 LAB — POCT CARDIAC MARKERS
CKMB, poc: 1.4
Myoglobin, poc: 81.8
Myoglobin, poc: 92.3
Operator id: 282201
Troponin i, poc: <0.05

## 2011-09-16 LAB — POCT I-STAT CREATININE
Creatinine, Ser: 1.1
Operator id: 282201

## 2011-11-23 ENCOUNTER — Ambulatory Visit (INDEPENDENT_AMBULATORY_CARE_PROVIDER_SITE_OTHER): Payer: Medicare Other | Admitting: Internal Medicine

## 2011-11-23 ENCOUNTER — Encounter: Payer: Self-pay | Admitting: Internal Medicine

## 2011-11-23 VITALS — BP 136/58 | HR 64 | Ht 63.0 in | Wt 134.4 lb

## 2011-11-23 DIAGNOSIS — R1314 Dysphagia, pharyngoesophageal phase: Secondary | ICD-10-CM

## 2011-11-23 DIAGNOSIS — R1310 Dysphagia, unspecified: Secondary | ICD-10-CM

## 2011-11-23 DIAGNOSIS — K222 Esophageal obstruction: Secondary | ICD-10-CM

## 2011-11-23 DIAGNOSIS — K219 Gastro-esophageal reflux disease without esophagitis: Secondary | ICD-10-CM

## 2011-11-23 NOTE — Assessment & Plan Note (Signed)
She is now a recurrent symptomatic esophageal stricture with dysphagia, I believe. Her heartburn symptoms appear under control. She could have a component of dysmotility. At this point we'll plan for repeat upper endoscopy with likely esophageal dilation which she has tolerated and benefited from in the past.The risks and benefits as well as alternatives of endoscopic procedure(s) have been discussed and reviewed. All questions answered. The patient agrees to proceed.

## 2011-11-23 NOTE — Progress Notes (Signed)
  Subjective:    Patient ID: Lonia Blood, female    DOB: 1920/07/09, 77 y.o.   MRN: [id number]  HPI 77 yo ww with history of GERD and esophageal stricture. Last dilated about 1.5 years ago and did well until past 1-2 months with solid food dysphagia to oatmeal, cornbread and meat. Denies heartburn, bleeding or weight loss. Similar to prior problems. She is compliant with PPI. No Known Allergies Outpatient Prescriptions Prior to Visit  Medication Sig Dispense Refill  . amLODipine (NORVASC) 2.5 MG tablet Take 2.5 mg by mouth daily.        Marland Kitchen aspirin EC 81 MG tablet Take 81 mg by mouth daily.        . Calcium Carbonate-Vitamin D (SUPER CALCIUM 600 + D 400 PO) Take 1 tablet by mouth 2 (two) times daily.        . Cholecalciferol 2000 UNITS CAPS Take 1 capsule by mouth daily.        Marland Kitchen co-enzyme Q-10 30 MG capsule Take 30 mg by mouth daily.        Marland Kitchen lisinopril (PRINIVIL,ZESTRIL) 10 MG tablet Take 10 mg by mouth 2 (two) times daily.        Marland Kitchen omeprazole (PRILOSEC) 40 MG capsule Take 40 mg by mouth daily.        Marland Kitchen oxybutynin (DITROPAN-XL) 5 MG 24 hr tablet Take 1-2 tablets by mouth twice daily as needed       . Plant Sterols and Stanols 450 MG TABS Take 1 tablet by mouth 2 (two) times daily.        Marland Kitchen pyridoxine (B-6) 100 MG tablet Take 100 mg by mouth daily.         Past Medical History  Diagnosis Date  . HTN (hypertension)   . GERD (gastroesophageal reflux disease)   . S/P dilatation of esophageal stricture   . HLD (hyperlipidemia)   . Hiatal hernia   . Thyroid disorder   . Arthritis    Past Surgical History  Procedure Date  . Cataract extraction   . Cholecystectomy   . Total hip arthroplasty   . Tonsillectomy   . Colonoscopy 01/18/2007    tortuous or redundant colon  . Upper gastrointestinal endoscopy 03/12/2010    esophageal stricture, hiatal hernia  . Appendectomy    History   Social History  . Marital Status: Widowed    Spouse Name: N/A    Number of Children: 1  . Years of  Education: N/A   Occupational History  . Retired    Social History Main Topics  . Smoking status: Never Smoker   . Smokeless tobacco: Never Used  . Alcohol Use: No  . Drug Use: No  . Sexually Active: None   Other Topics Concern  . None   Social History Narrative  . None   Family History  Problem Relation Age of Onset  . Diabetes Brother   . Heart disease Father         Review of Systems As above    Objective:   Physical Exam General:  NAD Eyes:   anicteric Lungs:  clear Heart:  S1S2 no rubs, murmurs or gallops             Assessment & Plan:

## 2011-11-23 NOTE — Patient Instructions (Signed)
You have been scheduled for an Endoscopy with separate instructions given.  

## 2011-11-26 ENCOUNTER — Ambulatory Visit (AMBULATORY_SURGERY_CENTER): Payer: Medicare Other | Admitting: Internal Medicine

## 2011-11-26 ENCOUNTER — Encounter: Payer: Self-pay | Admitting: Internal Medicine

## 2011-11-26 VITALS — BP 185/67 | HR 52 | Temp 97.0°F | Resp 18 | Ht 63.0 in | Wt 134.0 lb

## 2011-11-26 DIAGNOSIS — K449 Diaphragmatic hernia without obstruction or gangrene: Secondary | ICD-10-CM

## 2011-11-26 DIAGNOSIS — K219 Gastro-esophageal reflux disease without esophagitis: Secondary | ICD-10-CM

## 2011-11-26 DIAGNOSIS — K225 Diverticulum of esophagus, acquired: Secondary | ICD-10-CM

## 2011-11-26 DIAGNOSIS — R1314 Dysphagia, pharyngoesophageal phase: Secondary | ICD-10-CM

## 2011-11-26 DIAGNOSIS — K222 Esophageal obstruction: Secondary | ICD-10-CM

## 2011-11-26 MED ORDER — SODIUM CHLORIDE 0.9 % IV SOLN: 500.0000 mL | INTRAVENOUS | Status: DC

## 2011-11-26 NOTE — Op Note (Signed)
[facility] [address] [place], [place]  [address]  ENDOSCOPY PROCEDURE REPORT  PATIENT:  Taler, Kushner  MR#:  [id number] BIRTHDATE:  1920-08-19, 89 yrs. old  GENDER:  female  ENDOSCOPIST:  Iva Boop, MD, [facility]  PROCEDURE DATE:  11/26/2011 PROCEDURE:  EGD with balloon dilatation and biopsy ASA CLASS:  Class II INDICATIONS:  1) dysphagia  2) dilation of esophageal stricture  MEDICATIONS:   These medications were titrated to patient response per physician's verbal order, Fentanyl 25 mcg IV, Versed 2 mg IV TOPICAL ANESTHETIC:  Cetacaine Spray  DESCRIPTION OF PROCEDURE:   After the risks benefits and alternatives of the procedure were thoroughly explained, informed consent was obtained.  The LB-GIF H180 [id number] endoscope was introduced through the mouth and advanced to the second portion of the duodenum, without limitations.  The instrument was slowly withdrawn as the mucosa was carefully examined. <<PROCEDUREIMAGES>>  A stricture was found in the distal esophagus. Ring-like stricture at 35 cm, angulated GE junction.  A hiatal hernia was found. It was 3 cm in size. 35-38 cm.  Erythema was found in the antrum. Streaky.  The examination was otherwise normal.    Dilation was then performed at the distal esophagus  1) Dilator:  Balloon  Size(s):  18,19,20 mm Resistance:  minimal  Heme:  none Appearance:  no effect seen There was some ecchymosis effect but no tear so biopsy forceps disruption technique with tunnel biopsies was also used.  COMPLICATIONS:  None  ENDOSCOPIC IMPRESSION: 1) Stricture in the distal esophagus - dilated to 20 mm and disrupted with forceps 2) 3 cm hiatal hernia 3) Erythema in the antrum 4) Otherwise normal examination. RECOMMENDATIONS: post-dilation diet follow-up as needed if swallowing difficulty not resolved stay on PPI  Iva Boop, MD, [facility]  CC:  Leonette Most Record, MD and The Patient  n. eSIGNED:   Iva Boop at  11/26/2011 02:52 PM  Dollene Cleveland, [phone]

## 2011-11-26 NOTE — Progress Notes (Signed)
Patient did not have preoperative order for IV antibiotic SSI prophylaxis. (G8918)  Patient did not experience any of the following events: a burn prior to discharge; a fall within the facility; wrong site/side/patient/procedure/implant event; or a hospital transfer or hospital admission upon discharge from the facility. (G8907)  

## 2011-11-26 NOTE — Patient Instructions (Signed)
There was a narrow area where the esophagus and stomach join - called a stricture. I stretched it and used a biopsy tool to try to open or dilate the area so your swallowing is better. Please follow the diet instructions the RN gives you. If you have more swallowing problems call us back and let my nurse know. Iva Boop, MD, [facility]

## 2011-11-27 ENCOUNTER — Telehealth: Payer: Self-pay | Admitting: *Deleted

## 2011-11-27 NOTE — Telephone Encounter (Signed)
Left message on number given in admitting yest. ewm 

## 2013-10-01 ENCOUNTER — Emergency Department (HOSPITAL_BASED_OUTPATIENT_CLINIC_OR_DEPARTMENT_OTHER)
Admission: EM | Admit: 2013-10-01 | Discharge: 2013-10-01 | Disposition: A | Discharge status: Home / Self Care | Payer: Medicare Other | Attending: Emergency Medicine | Admitting: Emergency Medicine

## 2013-10-01 ENCOUNTER — Encounter (HOSPITAL_BASED_OUTPATIENT_CLINIC_OR_DEPARTMENT_OTHER): Payer: Self-pay | Admitting: Emergency Medicine

## 2013-10-01 ENCOUNTER — Emergency Department (HOSPITAL_BASED_OUTPATIENT_CLINIC_OR_DEPARTMENT_OTHER): Payer: Medicare Other

## 2013-10-01 DIAGNOSIS — K219 Gastro-esophageal reflux disease without esophagitis: Secondary | ICD-10-CM | POA: Insufficient documentation

## 2013-10-01 DIAGNOSIS — Z79899 Other long term (current) drug therapy: Secondary | ICD-10-CM | POA: Insufficient documentation

## 2013-10-01 DIAGNOSIS — Y9301 Activity, walking, marching and hiking: Secondary | ICD-10-CM | POA: Insufficient documentation

## 2013-10-01 DIAGNOSIS — S0081XA Abrasion of other part of head, initial encounter: Secondary | ICD-10-CM

## 2013-10-01 DIAGNOSIS — S81009A Unspecified open wound, unspecified knee, initial encounter: Secondary | ICD-10-CM | POA: Insufficient documentation

## 2013-10-01 DIAGNOSIS — S0990XA Unspecified injury of head, initial encounter: Secondary | ICD-10-CM

## 2013-10-01 DIAGNOSIS — W010XXA Fall on same level from slipping, tripping and stumbling without subsequent striking against object, initial encounter: Secondary | ICD-10-CM | POA: Insufficient documentation

## 2013-10-01 DIAGNOSIS — Z7982 Long term (current) use of aspirin: Secondary | ICD-10-CM | POA: Insufficient documentation

## 2013-10-01 DIAGNOSIS — M129 Arthropathy, unspecified: Secondary | ICD-10-CM | POA: Insufficient documentation

## 2013-10-01 DIAGNOSIS — S41009A Unspecified open wound of unspecified shoulder, initial encounter: Secondary | ICD-10-CM | POA: Insufficient documentation

## 2013-10-01 DIAGNOSIS — Z9889 Other specified postprocedural states: Secondary | ICD-10-CM | POA: Insufficient documentation

## 2013-10-01 DIAGNOSIS — S0180XA Unspecified open wound of other part of head, initial encounter: Secondary | ICD-10-CM | POA: Insufficient documentation

## 2013-10-01 DIAGNOSIS — I10 Essential (primary) hypertension: Secondary | ICD-10-CM | POA: Insufficient documentation

## 2013-10-01 DIAGNOSIS — S8002XA Contusion of left knee, initial encounter: Secondary | ICD-10-CM

## 2013-10-01 DIAGNOSIS — Y9289 Other specified places as the place of occurrence of the external cause: Secondary | ICD-10-CM | POA: Insufficient documentation

## 2013-10-01 DIAGNOSIS — Z23 Encounter for immunization: Secondary | ICD-10-CM | POA: Insufficient documentation

## 2013-10-01 DIAGNOSIS — T148XXA Other injury of unspecified body region, initial encounter: Secondary | ICD-10-CM

## 2013-10-01 DIAGNOSIS — I4891 Unspecified atrial fibrillation: Secondary | ICD-10-CM | POA: Insufficient documentation

## 2013-10-01 DIAGNOSIS — W19XXXA Unspecified fall, initial encounter: Secondary | ICD-10-CM

## 2013-10-01 DIAGNOSIS — E079 Disorder of thyroid, unspecified: Secondary | ICD-10-CM | POA: Insufficient documentation

## 2013-10-01 DIAGNOSIS — IMO0002 Reserved for concepts with insufficient information to code with codable children: Secondary | ICD-10-CM | POA: Insufficient documentation

## 2013-10-01 HISTORY — DX: Unspecified atrial fibrillation: I48.91

## 2013-10-01 MED ORDER — CEPHALEXIN 500 MG PO CAPS: 500.0000 mg | ORAL_CAPSULE | Freq: Four times a day (QID) | ORAL | Status: DC

## 2013-10-01 MED ORDER — HYDROCODONE-ACETAMINOPHEN 5-325 MG PO TABS
1.0000 | ORAL_TABLET | Freq: Once | ORAL | Status: AC
  Administered 2013-10-01: 1 via ORAL
  Filled 2013-10-01: qty 1

## 2013-10-01 MED ORDER — HYDROCODONE-ACETAMINOPHEN 5-325 MG PO TABS: 2.0000 | ORAL_TABLET | ORAL | Status: DC | PRN

## 2013-10-01 MED ORDER — TETANUS-DIPHTHERIA TOXOIDS TD 5-2 LFU IM INJ
0.5000 mL | INJECTION | Freq: Once | INTRAMUSCULAR | Status: AC
  Administered 2013-10-01: 0.5 mL via INTRAMUSCULAR
  Filled 2013-10-01: qty 0.5

## 2013-10-01 NOTE — ED Notes (Signed)
Patient here after falling this am out on back porch. Patient reports that the porch is cement covered in carpet. Unsure why she fell, no loc. Skin tears to right arm, face and left knee

## 2013-10-01 NOTE — ED Notes (Signed)
Patient transported to CT 

## 2013-10-01 NOTE — ED Provider Notes (Signed)
CSN: [id number]     Arrival date & time 10/01/13  6295 History   First MD Initiated Contact with Patient 10/01/13 1010     Chief Complaint  Patient presents with  . Fall   (Consider location/radiation/quality/duration/timing/severity/associated sxs/prior Treatment) HPI Comments: Patient is a 79 year old female past medical history of hypertension, hiatal hernia, arthritis. She presents to the ER after a fall. She was walking out of her back porch when she tripped and fell. She injured her left knee, cause multiple skin tears to the right upper extremity, and also struck her for head on the ground. She denies headache or neck pain. She denies any chest pain, shortness of breath, or abdominal pain. She has been ambulatory since this fall however her knee appears to be "tightening up" on her.  Patient is a 79 y.o. female presenting with fall. The history is provided by the patient.  Fall This is a new problem. The current episode started less than 1 hour ago. The problem occurs constantly. The problem has not changed since onset.Pertinent negatives include no chest pain, no abdominal pain, no headaches and no shortness of breath. Nothing aggravates the symptoms. Nothing relieves the symptoms. She has tried nothing for the symptoms. The treatment provided no relief.    Past Medical History  Diagnosis Date  . HTN (hypertension)   . GERD (gastroesophageal reflux disease)   . S/P dilatation of esophageal stricture   . HLD (hyperlipidemia)   . Hiatal hernia   . Thyroid disorder   . Arthritis    Past Surgical History  Procedure Laterality Date  . Cataract extraction    . Cholecystectomy    . Total hip arthroplasty    . Tonsillectomy    . Colonoscopy  01/18/2007    tortuous or redundant colon  . Upper gastrointestinal endoscopy  03/12/2010, 11/26/2011    esophageal stricture dilations, hiatal hernia  . Appendectomy     Family History  Problem Relation Age of Onset  . Diabetes Brother   .  Heart disease Father   . Colon cancer Neg Hx   . Esophageal cancer Neg Hx   . Stomach cancer Neg Hx    History  Substance Use Topics  . Smoking status: Never Smoker   . Smokeless tobacco: Never Used  . Alcohol Use: No   OB History   Grav Para Term Preterm Abortions TAB SAB Ect Mult Living                 Review of Systems  Respiratory: Negative for shortness of breath.   Cardiovascular: Negative for chest pain.  Gastrointestinal: Negative for abdominal pain.  Neurological: Negative for headaches.  All other systems reviewed and are negative.    Allergies  Review of patient's allergies indicates no known allergies.  Home Medications   Current Outpatient Rx  Name  Route  Sig  Dispense  Refill  . amLODipine (NORVASC) 2.5 MG tablet   Oral   Take 2.5 mg by mouth daily.           Marland Kitchen aspirin EC 81 MG tablet   Oral   Take 81 mg by mouth daily.           . Calcium Carbonate-Vitamin D (SUPER CALCIUM 600 + D 400 PO)   Oral   Take 1 tablet by mouth 2 (two) times daily.           . Cholecalciferol 2000 UNITS CAPS   Oral   Take 1 capsule by mouth  daily.           . co-enzyme Q-10 30 MG capsule   Oral   Take 30 mg by mouth daily.           Marland Kitchen lisinopril (PRINIVIL,ZESTRIL) 10 MG tablet   Oral   Take 10 mg by mouth 2 (two) times daily.           Marland Kitchen omeprazole (PRILOSEC) 40 MG capsule   Oral   Take 40 mg by mouth daily.           Marland Kitchen oxybutynin (DITROPAN-XL) 5 MG 24 hr tablet      Take 1-2 tablets by mouth twice daily as needed          . Plant Sterols and Stanols 450 MG TABS   Oral   Take 1 tablet by mouth 2 (two) times daily.           Marland Kitchen pyridoxine (B-6) 100 MG tablet   Oral   Take 100 mg by mouth daily.            There were no vitals taken for this visit. Physical Exam  Nursing note and vitals reviewed. Constitutional: She is oriented to person, place, and time. She appears well-developed and well-nourished. No distress.  Patient is a  79 year old female. She is awake alert and appropriate.  HENT:  Head: Normocephalic and atraumatic.  Mouth/Throat: Oropharynx is clear and moist.  Eyes: EOM are normal. Pupils are equal, round, and reactive to light.  There is no diplopia on upward gaze.  Neck: Normal range of motion. Neck supple.  There is no cervical spine tenderness to palpation and no step-offs. She has painless range of motion in all directions.  Cardiovascular: Normal rate and regular rhythm.  Exam reveals no gallop and no friction rub.   No murmur heard. Pulmonary/Chest: Effort normal and breath sounds normal. No respiratory distress. She has no wheezes.  Abdominal: Soft. Bowel sounds are normal. She exhibits no distension. There is no tenderness.  Musculoskeletal: Normal range of motion.  The right upper extremity is noted to have multiple superficial skin tears, however no deep lacerations. These extend from just below the shoulder to the mid forearm. Distal pulses motor and sensory are intact.  The left knee has an abrasion on the anterior aspect of the patella. There is mild to moderate swelling surrounding this area. She has good range of motion without crepitus.  Lymphadenopathy:    She has no cervical adenopathy.  Neurological: She is alert and oriented to person, place, and time. No cranial nerve deficit. She exhibits normal muscle tone. Coordination normal.  Skin: Skin is warm and dry. She is not diaphoretic.    ED Course  Procedures (including critical care time) Labs Review Labs Reviewed - No data to display Imaging Review No results found.  EKG Interpretation   None       MDM  No diagnosis found. Patient presents here after a fall with extensive skin tears on the right upper extremity. There is nothing that is repairable with sutures this will require wound care. The daughter is a Engineer, civil (consulting) and she agrees to help look after her. CT of the head is negative and x-ray of the left knee reveals no  evidence for fracture. She is able to ambulate on the knee I do not feel as though there is an emergent process. If she is not improving she will require followup with her primary care Dr. to discuss.  She will  be discharged to home with instructions for local wound care. I will also treat with Keflex due to the number of skin tear she has sustained.    Geoffery Lyons, MD 10/01/13 1134

## 2015-03-31 ENCOUNTER — Emergency Department (HOSPITAL_BASED_OUTPATIENT_CLINIC_OR_DEPARTMENT_OTHER): Payer: Medicare Other

## 2015-03-31 ENCOUNTER — Inpatient Hospital Stay (HOSPITAL_BASED_OUTPATIENT_CLINIC_OR_DEPARTMENT_OTHER)
Admission: EM | Admit: 2015-03-31 | Discharge: 2015-04-03 | DRG: 200 | Disposition: A | Discharge status: Skilled Nursing Facility | Payer: Medicare Other | Attending: Surgery | Admitting: Surgery

## 2015-03-31 ENCOUNTER — Encounter (HOSPITAL_BASED_OUTPATIENT_CLINIC_OR_DEPARTMENT_OTHER): Payer: Self-pay | Admitting: *Deleted

## 2015-03-31 DIAGNOSIS — S270XXA Traumatic pneumothorax, initial encounter: Secondary | ICD-10-CM | POA: Diagnosis not present

## 2015-03-31 DIAGNOSIS — Z79899 Other long term (current) drug therapy: Secondary | ICD-10-CM

## 2015-03-31 DIAGNOSIS — W19XXXA Unspecified fall, initial encounter: Secondary | ICD-10-CM | POA: Diagnosis present

## 2015-03-31 DIAGNOSIS — K219 Gastro-esophageal reflux disease without esophagitis: Secondary | ICD-10-CM | POA: Diagnosis present

## 2015-03-31 DIAGNOSIS — S2241XA Multiple fractures of ribs, right side, initial encounter for closed fracture: Secondary | ICD-10-CM | POA: Diagnosis present

## 2015-03-31 DIAGNOSIS — J939 Pneumothorax, unspecified: Secondary | ICD-10-CM | POA: Diagnosis not present

## 2015-03-31 DIAGNOSIS — S2249XA Multiple fractures of ribs, unspecified side, initial encounter for closed fracture: Secondary | ICD-10-CM | POA: Diagnosis present

## 2015-03-31 DIAGNOSIS — E785 Hyperlipidemia, unspecified: Secondary | ICD-10-CM | POA: Diagnosis present

## 2015-03-31 DIAGNOSIS — D62 Acute posthemorrhagic anemia: Secondary | ICD-10-CM | POA: Diagnosis not present

## 2015-03-31 DIAGNOSIS — S2231XA Fracture of one rib, right side, initial encounter for closed fracture: Secondary | ICD-10-CM

## 2015-03-31 DIAGNOSIS — I10 Essential (primary) hypertension: Secondary | ICD-10-CM | POA: Diagnosis present

## 2015-03-31 DIAGNOSIS — I4891 Unspecified atrial fibrillation: Secondary | ICD-10-CM | POA: Diagnosis present

## 2015-03-31 DIAGNOSIS — Z7982 Long term (current) use of aspirin: Secondary | ICD-10-CM

## 2015-03-31 DIAGNOSIS — S2231XS Fracture of one rib, right side, sequela: Secondary | ICD-10-CM

## 2015-03-31 DIAGNOSIS — Z96649 Presence of unspecified artificial hip joint: Secondary | ICD-10-CM | POA: Diagnosis present

## 2015-03-31 LAB — CBC WITH DIFFERENTIAL/PLATELET
BASOS PCT: 0 % (ref 0–1)
Basophils Absolute: 0 10*3/uL (ref 0.0–0.1)
EOS ABS: 0 10*3/uL (ref 0.0–0.7)
EOS PCT: 0 % (ref 0–5)
HEMATOCRIT: 38.4 % (ref 36.0–46.0)
HEMOGLOBIN: 12.7 g/dL (ref 12.0–15.0)
LYMPHS ABS: 0.9 10*3/uL (ref 0.7–4.0)
LYMPHS PCT: 6 % — AB (ref 12–46)
MCH: 28.5 pg (ref 26.0–34.0)
MCHC: 33.1 g/dL (ref 30.0–36.0)
MCV: 86.3 fL (ref 78.0–100.0)
Monocytes Absolute: 0.6 10*3/uL (ref 0.1–1.0)
Monocytes Relative: 4 % (ref 3–12)
NEUTROS ABS: 13.8 10*3/uL — AB (ref 1.7–7.7)
NEUTROS PCT: 90 % — AB (ref 43–77)
Platelets: 255 10*3/uL (ref 150–400)
RBC: 4.45 MIL/uL (ref 3.87–5.11)
RDW: 14.7 % (ref 11.5–15.5)
WBC: 15.3 10*3/uL — ABNORMAL HIGH (ref 4.0–10.5)

## 2015-03-31 LAB — BASIC METABOLIC PANEL
Anion gap: 9 (ref 5–15)
BUN: 18 mg/dL (ref 6–23)
CALCIUM: 9.2 mg/dL (ref 8.4–10.5)
CHLORIDE: 106 mmol/L (ref 96–112)
CO2: 24 mmol/L (ref 19–32)
CREATININE: 0.88 mg/dL (ref 0.50–1.10)
GFR calc Af Amer: 64 mL/min — ABNORMAL LOW (ref >90)
GFR, EST NON AFRICAN AMERICAN: 55 mL/min — AB (ref >90)
Glucose, Bld: 189 mg/dL — ABNORMAL HIGH (ref 70–99)
POTASSIUM: 4.2 mmol/L (ref 3.5–5.1)
Sodium: 139 mmol/L (ref 135–145)

## 2015-03-31 MED ORDER — MORPHINE SULFATE 2 MG/ML IJ SOLN
2.0000 mg | Freq: Once | INTRAMUSCULAR | Status: AC
  Administered 2015-03-31: 2 mg via INTRAVENOUS
  Filled 2015-03-31: qty 1

## 2015-03-31 MED ORDER — ONDANSETRON HCL 4 MG/2ML IJ SOLN
4.0000 mg | Freq: Once | INTRAMUSCULAR | Status: AC
  Administered 2015-03-31: 4 mg via INTRAVENOUS
  Filled 2015-03-31: qty 2

## 2015-03-31 NOTE — ED Notes (Signed)
Pt fell in ditch on side of road while taking garbage out, she did not hit her head or have any loc.  She struck her right ribs and right shoulder when she fell and she states that when she raised her right arm after the fall she felt her shoulder pop back into place.  She has right rib and right shoulder pain now. No sob

## 2015-03-31 NOTE — ED Provider Notes (Signed)
CSN: [id number]     Arrival date & time 03/31/15  1926 History   First MD Initiated Contact with Patient 03/31/15 2148     Chief Complaint  Patient presents with  . Fall     (Consider location/radiation/quality/duration/timing/severity/associated sxs/prior Treatment) Patient is a 81 y.o. female presenting with fall. The history is provided by the patient.  Fall This is a new (Patient was taking the garbage out and lost her balance falling in the ditch on the right side. She initially had severe pain in her right shoulder and ribs. While moving her shoulder she heard a huge pop and it went back into place. ) problem. The current episode started 1 to 2 hours ago. The problem occurs constantly. The problem has not changed since onset.Associated symptoms include chest pain. Pertinent negatives include no abdominal pain, no headaches and no shortness of breath. Associated symptoms comments: Patient continues to have right rib pain. She denies any shortness of breath. No head injury, LOC. She denies abdominal pain, nausea or vomiting. Only painful when she moves. She denies back pain was able to ambulate after the event. Patient does have a history of atrial fibrillation intermittently but takes no anticoagulation.. The symptoms are aggravated by twisting and bending. Nothing relieves the symptoms. She has tried nothing for the symptoms. The treatment provided no relief.    Past Medical History  Diagnosis Date  . HTN (hypertension)   . GERD (gastroesophageal reflux disease)   . S/P dilatation of esophageal stricture   . HLD (hyperlipidemia)   . Hiatal hernia   . Thyroid disorder   . Arthritis   . Atrial fibrillation    Past Surgical History  Procedure Laterality Date  . Cataract extraction    . Cholecystectomy    . Total hip arthroplasty    . Tonsillectomy    . Colonoscopy  01/18/2007    tortuous or redundant colon  . Upper gastrointestinal endoscopy  03/12/2010, 11/26/2011    esophageal  stricture dilations, hiatal hernia  . Appendectomy     Family History  Problem Relation Age of Onset  . Diabetes Brother   . Heart disease Father   . Colon cancer Neg Hx   . Esophageal cancer Neg Hx   . Stomach cancer Neg Hx    History  Substance Use Topics  . Smoking status: Never Smoker   . Smokeless tobacco: Never Used  . Alcohol Use: No   OB History    No data available     Review of Systems  Respiratory: Negative for shortness of breath.   Cardiovascular: Positive for chest pain.  Gastrointestinal: Negative for abdominal pain.  Neurological: Negative for headaches.  All other systems reviewed and are negative.     Allergies  Review of patient's allergies indicates no known allergies.  Home Medications   Prior to Admission medications   Medication Sig Start Date End Date Taking? Authorizing Provider  amLODipine (NORVASC) 2.5 MG tablet Take 2.5 mg by mouth daily.      Historical Provider, MD  aspirin EC 81 MG tablet Take 81 mg by mouth daily.      Historical Provider, MD  Calcium Carbonate-Vitamin D (SUPER CALCIUM 600 + D 400 PO) Take 1 tablet by mouth 2 (two) times daily.      Historical Provider, MD  cephALEXin (KEFLEX) 500 MG capsule Take 1 capsule (500 mg total) by mouth 4 (four) times daily. 10/01/13   Geoffery Lyons, MD  Cholecalciferol 2000 UNITS CAPS Take 1 capsule  by mouth daily.      Historical Provider, MD  co-enzyme Q-10 30 MG capsule Take 30 mg by mouth daily.      Historical Provider, MD  HYDROcodone-acetaminophen (NORCO) 5-325 MG per tablet Take 2 tablets by mouth every 4 (four) hours as needed for pain. 10/01/13   Geoffery Lyons, MD  lisinopril (PRINIVIL,ZESTRIL) 10 MG tablet Take 10 mg by mouth 2 (two) times daily.      Historical Provider, MD  omeprazole (PRILOSEC) 40 MG capsule Take 40 mg by mouth daily.      Historical Provider, MD  oxybutynin (DITROPAN-XL) 5 MG 24 hr tablet Take 1-2 tablets by mouth twice daily as needed     Historical Provider, MD   Plant Sterols and Stanols 450 MG TABS Take 1 tablet by mouth 2 (two) times daily.      Historical Provider, MD  pyridoxine (B-6) 100 MG tablet Take 100 mg by mouth daily.      Historical Provider, MD   BP 141/59 mmHg  Pulse 56  Temp(Src) 98 F (36.7 C) (Oral)  Resp 18  Ht  (1.6 m)  Wt 128 lb (58.06 kg)  BMI 22.68 kg/m2  SpO2 99% Physical Exam  Constitutional: She is oriented to person, place, and time. She appears well-developed and well-nourished. No distress.  HENT:  Head: Normocephalic and atraumatic.  Mouth/Throat: Oropharynx is clear and moist.  Eyes: Conjunctivae and EOM are normal. Pupils are equal, round, and reactive to light.  Neck: Normal range of motion. Neck supple. No spinous process tenderness and no muscular tenderness present.  Cardiovascular: Normal rate, regular rhythm and intact distal pulses.   No murmur heard. Pulmonary/Chest: Effort normal. No tachypnea. No respiratory distress. She has decreased breath sounds. She has no wheezes. She has no rales. She exhibits tenderness.  Abdominal: Soft. She exhibits no distension. There is no tenderness. There is no rebound and no guarding.  Musculoskeletal: Normal range of motion. She exhibits no edema or tenderness.       Right shoulder: Normal.       Right hip: Normal.       Left hip: Normal.  Neurological: She is alert and oriented to person, place, and time.  Skin: Skin is warm and dry. No rash noted. No erythema.  Psychiatric: She has a normal mood and affect. Her behavior is normal.  Nursing note and vitals reviewed.   ED Course  Procedures (including critical care time) Labs Review Labs Reviewed  CBC WITH DIFFERENTIAL/PLATELET - Abnormal; Notable for the following:    WBC 15.3 (*)    Neutrophils Relative % 90 (*)    Neutro Abs 13.8 (*)    Lymphocytes Relative 6 (*)    All other components within normal limits  BASIC METABOLIC PANEL - Abnormal; Notable for the following:    Glucose, Bld 189 (*)     GFR calc non Af Amer 55 (*)    GFR calc Af Amer 64 (*)    All other components within normal limits    Imaging Review Dg Ribs Unilateral W/chest Right  03/31/2015   CLINICAL DATA:  Status post fall. Hit right ribs and right shoulder, with right rib and shoulder pain. Initial encounter.  EXAM: RIGHT RIBS AND CHEST - 3+ VIEW  COMPARISON:  Chest radiograph performed 09/07/2006  FINDINGS: There are displaced fractures of the right lateral third through seventh ribs, with associated small right apical pneumothorax. Underlying chronic right-sided rib deformities are also seen. Mild left basilar atelectasis  or scarring is noted.  The cardiomediastinal silhouette is borderline normal in size. The right shoulder is grossly unremarkable in appearance. Clips are noted overlying the right upper quadrant.  IMPRESSION: Displaced fractures of the right lateral third through seventh ribs, with associated small right apical pneumothorax. Mild left basilar atelectasis or scarring noted.  These results were called by telephone at the time of interpretation on 03/31/2015 at 9:25 pm to Dr. Gwyneth Sprout, who verbally acknowledged these results.   Electronically Signed   By: Roanna Raider M.D.   On: 03/31/2015 21:25   Dg Shoulder Right  03/31/2015   CLINICAL DATA:  81 year old female with a history of fall  EXAM: RIGHT SHOULDER - 2+ VIEW  COMPARISON:  09/07/2006  FINDINGS: Osteopenia.  Sequential rib fractures involving ribs , incompletely imaged on the shoulder plain film. There is associated right pneumothorax.  No evidence of acute fracture of the proximal right humerus. Glenohumeral joint appears congruent. No clavicle fracture identified. No scapular fracture identified.  IMPRESSION: No acute fracture of the right shoulder identified.  There are sequential fractures of the right third through tenth ribs. This is incompletely imaged on the current study.  Right-sided pneumothorax.  These results were called by telephone  at the time of interpretation on 03/31/2015 at 9:26 pm to Dr. Gwyneth Sprout , who verbally acknowledged these results.  Signed,  Yvone Neu. Loreta Ave, DO  [facility]  [facility]   Electronically Signed   By: Gilmer Mor D.O.   On: 03/31/2015 21:28     EKG Interpretation   Date/Time:  Sunday March 31 2015 22:30:46 EDT Ventricular Rate:  53 PR Interval:  168 QRS Duration: 94 QT Interval:  450 QTC Calculation: 422 R Axis:   66 Text Interpretation:  Sinus bradycardia Left ventricular hypertrophy with  repolarization abnormality Cannot rule out Septal infarct , age  undetermined , new T wave inversion Inferior leads Confirmed by Anitra Lauth   MD, Alphonzo Lemmings ([id number]) on 03/31/2015 11:10:36 PM      MDM   Final diagnoses:  Rib fractures, right, closed, initial encounter  Pneumothorax    Patient with a mechanical fall today when she was taking out the garbage. She landed on her right side and ribs with severe pain. Initially sounds like she most likely had a shoulder dislocation however she was attempting to range her shoulder popped back in at home. She continued to have rib pain supplement for evaluation. She denies any head injury, LOC or syncope.  No abdominal pain and she was able to ambulate without difficulty. X-rays here show 7 right-sided rib fractures with an associated small pneumothorax.   She is currently not tachypnea can satting 99%. She does not M. Uncomfortable unless she attempts to move.  We'll discuss patient with trauma surgery and transferred to [facility].    Gwyneth Sprout, MD 03/31/15 [phone]

## 2015-03-31 NOTE — Progress Notes (Signed)
Placed patient on 4 liter nasal cannula with humidity per MD.

## 2015-04-01 ENCOUNTER — Emergency Department (HOSPITAL_COMMUNITY): Payer: Medicare Other

## 2015-04-01 ENCOUNTER — Encounter (HOSPITAL_COMMUNITY): Payer: Self-pay | Admitting: Radiology

## 2015-04-01 ENCOUNTER — Inpatient Hospital Stay (HOSPITAL_COMMUNITY): Payer: Medicare Other

## 2015-04-01 DIAGNOSIS — S2249XA Multiple fractures of ribs, unspecified side, initial encounter for closed fracture: Secondary | ICD-10-CM | POA: Diagnosis present

## 2015-04-01 DIAGNOSIS — S2241XA Multiple fractures of ribs, right side, initial encounter for closed fracture: Secondary | ICD-10-CM | POA: Diagnosis present

## 2015-04-01 DIAGNOSIS — Z7982 Long term (current) use of aspirin: Secondary | ICD-10-CM | POA: Diagnosis not present

## 2015-04-01 DIAGNOSIS — S270XXA Traumatic pneumothorax, initial encounter: Secondary | ICD-10-CM | POA: Diagnosis present

## 2015-04-01 DIAGNOSIS — K219 Gastro-esophageal reflux disease without esophagitis: Secondary | ICD-10-CM | POA: Diagnosis present

## 2015-04-01 DIAGNOSIS — I10 Essential (primary) hypertension: Secondary | ICD-10-CM | POA: Diagnosis present

## 2015-04-01 DIAGNOSIS — I4891 Unspecified atrial fibrillation: Secondary | ICD-10-CM | POA: Diagnosis present

## 2015-04-01 DIAGNOSIS — W19XXXA Unspecified fall, initial encounter: Secondary | ICD-10-CM | POA: Diagnosis present

## 2015-04-01 DIAGNOSIS — E785 Hyperlipidemia, unspecified: Secondary | ICD-10-CM | POA: Diagnosis present

## 2015-04-01 DIAGNOSIS — Z79899 Other long term (current) drug therapy: Secondary | ICD-10-CM | POA: Diagnosis not present

## 2015-04-01 DIAGNOSIS — Z96649 Presence of unspecified artificial hip joint: Secondary | ICD-10-CM | POA: Diagnosis present

## 2015-04-01 DIAGNOSIS — J939 Pneumothorax, unspecified: Secondary | ICD-10-CM | POA: Diagnosis present

## 2015-04-01 DIAGNOSIS — D62 Acute posthemorrhagic anemia: Secondary | ICD-10-CM | POA: Diagnosis present

## 2015-04-01 LAB — CBC
HCT: 34.7 % — ABNORMAL LOW (ref 36.0–46.0)
Hemoglobin: 11.4 g/dL — ABNORMAL LOW (ref 12.0–15.0)
MCH: 27.8 pg (ref 26.0–34.0)
MCHC: 32.9 g/dL (ref 30.0–36.0)
MCV: 84.6 fL (ref 78.0–100.0)
Platelets: 233 10*3/uL (ref 150–400)
RBC: 4.1 MIL/uL (ref 3.87–5.11)
RDW: 14.7 % (ref 11.5–15.5)
WBC: 10 10*3/uL (ref 4.0–10.5)

## 2015-04-01 LAB — BASIC METABOLIC PANEL
Anion gap: 8 (ref 5–15)
BUN: 14 mg/dL (ref 6–23)
CALCIUM: 8.7 mg/dL (ref 8.4–10.5)
CO2: 26 mmol/L (ref 19–32)
CREATININE: 0.9 mg/dL (ref 0.50–1.10)
Chloride: 99 mmol/L (ref 96–112)
GFR calc Af Amer: 62 mL/min — ABNORMAL LOW (ref >90)
GFR calc non Af Amer: 54 mL/min — ABNORMAL LOW (ref >90)
GLUCOSE: 159 mg/dL — AB (ref 70–99)
Potassium: 4.1 mmol/L (ref 3.5–5.1)
Sodium: 133 mmol/L — ABNORMAL LOW (ref 135–145)

## 2015-04-01 LAB — TROPONIN I
Troponin I: <0.03 ng/mL (ref <0.031)
Troponin I: <0.03 ng/mL (ref <0.031)

## 2015-04-01 LAB — MRSA PCR SCREENING: MRSA by PCR: NEGATIVE

## 2015-04-01 MED ORDER — HYDROCODONE-ACETAMINOPHEN 5-325 MG PO TABS
1.0000 | ORAL_TABLET | ORAL | Status: DC | PRN
  Filled 2015-04-01: qty 1

## 2015-04-01 MED ORDER — IOHEXOL 300 MG/ML  SOLN
75.0000 mL | Freq: Once | INTRAMUSCULAR | Status: AC | PRN
  Administered 2015-04-01: 75 mL via INTRAVENOUS

## 2015-04-01 MED ORDER — AMLODIPINE BESYLATE 2.5 MG PO TABS
2.5000 mg | ORAL_TABLET | Freq: Every day | ORAL | Status: DC
  Administered 2015-04-01 – 2015-04-03 (×3): 2.5 mg via ORAL
  Filled 2015-04-01 (×3): qty 1

## 2015-04-01 MED ORDER — CETYLPYRIDINIUM CHLORIDE 0.05 % MT LIQD
7.0000 mL | Freq: Two times a day (BID) | OROMUCOSAL | Status: DC
  Administered 2015-04-01: 7 mL via OROMUCOSAL

## 2015-04-01 MED ORDER — LISINOPRIL 10 MG PO TABS
10.0000 mg | ORAL_TABLET | Freq: Two times a day (BID) | ORAL | Status: DC
  Administered 2015-04-01 – 2015-04-03 (×6): 10 mg via ORAL
  Filled 2015-04-01: qty 1
  Filled 2015-04-01: qty 2
  Filled 2015-04-01 (×2): qty 1
  Filled 2015-04-01: qty 2
  Filled 2015-04-01 (×2): qty 1

## 2015-04-01 MED ORDER — HYDROCODONE-ACETAMINOPHEN 5-325 MG PO TABS
1.0000 | ORAL_TABLET | ORAL | Status: DC | PRN
  Administered 2015-04-02: 1 via ORAL

## 2015-04-01 MED ORDER — SODIUM CHLORIDE 0.9 % IV SOLN
INTRAVENOUS | Status: DC
  Administered 2015-04-01: 21:00:00 via INTRAVENOUS
  Administered 2015-04-01: 1000 mL via INTRAVENOUS
  Administered 2015-04-02: 20:00:00 via INTRAVENOUS

## 2015-04-01 MED ORDER — PANTOPRAZOLE SODIUM 40 MG PO TBEC
40.0000 mg | DELAYED_RELEASE_TABLET | Freq: Every day | ORAL | Status: DC
  Administered 2015-04-01 – 2015-04-03 (×3): 40 mg via ORAL
  Filled 2015-04-01 (×3): qty 1

## 2015-04-01 MED ORDER — CHLORHEXIDINE GLUCONATE 0.12 % MT SOLN
15.0000 mL | Freq: Two times a day (BID) | OROMUCOSAL | Status: DC
  Administered 2015-04-01: 15 mL via OROMUCOSAL
  Filled 2015-04-01: qty 15

## 2015-04-01 MED ORDER — MORPHINE SULFATE 2 MG/ML IJ SOLN: 1.0000 mg | INTRAMUSCULAR | Status: DC | PRN

## 2015-04-01 MED ORDER — DOCUSATE SODIUM 100 MG PO CAPS
100.0000 mg | ORAL_CAPSULE | Freq: Two times a day (BID) | ORAL | Status: DC
  Administered 2015-04-01 – 2015-04-03 (×6): 100 mg via ORAL
  Filled 2015-04-01 (×7): qty 1

## 2015-04-01 MED ORDER — MORPHINE SULFATE 4 MG/ML IJ SOLN: 3.0000 mg | INTRAMUSCULAR | Status: DC | PRN

## 2015-04-01 MED ORDER — ENOXAPARIN SODIUM 30 MG/0.3ML ~~LOC~~ SOLN
30.0000 mg | SUBCUTANEOUS | Status: DC
  Administered 2015-04-01 – 2015-04-02 (×2): 30 mg via SUBCUTANEOUS
  Filled 2015-04-01 (×3): qty 0.3

## 2015-04-01 MED ORDER — MORPHINE SULFATE 2 MG/ML IJ SOLN: 2.0000 mg | INTRAMUSCULAR | Status: DC | PRN

## 2015-04-01 MED ORDER — HYDROCODONE-ACETAMINOPHEN 5-325 MG PO TABS: 2.0000 | ORAL_TABLET | ORAL | Status: DC | PRN

## 2015-04-01 MED ORDER — ONDANSETRON HCL 4 MG PO TABS: 4.0000 mg | ORAL_TABLET | Freq: Four times a day (QID) | ORAL | Status: DC | PRN

## 2015-04-01 MED ORDER — ONDANSETRON HCL 4 MG/2ML IJ SOLN: 4.0000 mg | Freq: Four times a day (QID) | INTRAMUSCULAR | Status: DC | PRN

## 2015-04-01 MED ORDER — ENOXAPARIN SODIUM 30 MG/0.3ML ~~LOC~~ SOLN
30.0000 mg | SUBCUTANEOUS | Status: DC
  Administered 2015-04-01: 30 mg via SUBCUTANEOUS
  Filled 2015-04-01 (×2): qty 0.3

## 2015-04-01 NOTE — H&P (Signed)
History   [name] is an 81 y.o. female.   Chief Complaint:  Chief Complaint  Patient presents with  . Fall    Fall   this patient presents status post a fall this past evening. She was transferred from [facility]. She fell taking out the garbage. She landed on her right side. She presented in planning of right-sided chest pain. She denies shortness of breath. She was found to have multiple right rib fractures and a small right apical pneumothorax so was transferred for trauma care. She denies loss of consciousness, headache, or neck pain. After arrival at [facility], she still denies shortness of breath.  She is otherwise without complaints.  Past Medical History  Diagnosis Date  . HTN (hypertension)   . GERD (gastroesophageal reflux disease)   . S/P dilatation of esophageal stricture   . HLD (hyperlipidemia)   . Hiatal hernia   . Thyroid disorder   . Arthritis   . Atrial fibrillation     Past Surgical History  Procedure Laterality Date  . Cataract extraction    . Cholecystectomy    . Total hip arthroplasty    . Tonsillectomy    . Colonoscopy  01/18/2007    tortuous or redundant colon  . Upper gastrointestinal endoscopy  03/12/2010, 11/26/2011    esophageal stricture dilations, hiatal hernia  . Appendectomy      Family History  Problem Relation Age of Onset  . Diabetes Brother   . Heart disease Father   . Colon cancer Neg Hx   . Esophageal cancer Neg Hx   . Stomach cancer Neg Hx    Social History:  reports that she has never smoked. She has never used smokeless tobacco. She reports that she does not drink alcohol or use illicit drugs.  Allergies  No Known Allergies  Home Medications   (Not in a hospital admission)  Trauma Course   Results for orders placed or performed during the hospital encounter of 03/31/15 (from the past 48 hour(s))  CBC with Differential/Platelet     Status: Abnormal   Collection Time: 03/31/15 10:15 PM  Result Value Ref  Range   WBC 15.3 (H) 4.0 - 10.5 K/uL   RBC 4.45 3.87 - 5.11 MIL/uL   Hemoglobin 12.7 12.0 - 15.0 g/dL   HCT 38.4 36.0 - 46.0 %   MCV 86.3 78.0 - 100.0 fL   MCH 28.5 26.0 - 34.0 pg   MCHC 33.1 30.0 - 36.0 g/dL   RDW 14.7 11.5 - 15.5 %   Platelets 255 150 - 400 K/uL   Neutrophils Relative % 90 (H) 43 - 77 %   Neutro Abs 13.8 (H) 1.7 - 7.7 K/uL   Lymphocytes Relative 6 (L) 12 - 46 %   Lymphs Abs 0.9 0.7 - 4.0 K/uL   Monocytes Relative 4 3 - 12 %   Monocytes Absolute 0.6 0.1 - 1.0 K/uL   Eosinophils Relative 0 0 - 5 %   Eosinophils Absolute 0.0 0.0 - 0.7 K/uL   Basophils Relative 0 0 - 1 %   Basophils Absolute 0.0 0.0 - 0.1 K/uL  Basic metabolic panel     Status: Abnormal   Collection Time: 03/31/15 10:15 PM  Result Value Ref Range   Sodium 139 135 - 145 mmol/L   Potassium 4.2 3.5 - 5.1 mmol/L   Chloride 106 96 - 112 mmol/L   CO2 24 19 - 32 mmol/L   Glucose, Bld 189 (H) 70 - 99  mg/dL   BUN 18 6 - 23 mg/dL   Creatinine, Ser 0.88 0.50 - 1.10 mg/dL   Calcium 9.2 8.4 - 10.5 mg/dL   GFR calc non Af Amer 55 (L) >90 mL/min   GFR calc Af Amer 64 (L) >90 mL/min    Comment: (NOTE) The eGFR has been calculated using the CKD EPI equation. This calculation has not been validated in all clinical situations. eGFR's persistently <90 mL/min signify possible Chronic Kidney Disease.    Anion gap 9 5 - 15   Dg Ribs Unilateral W/chest Right  03/31/2015   CLINICAL DATA:  Status post fall. Hit right ribs and right shoulder, with right rib and shoulder pain. Initial encounter.  EXAM: RIGHT RIBS AND CHEST - 3+ VIEW  COMPARISON:  Chest radiograph performed 09/07/2006  FINDINGS: There are displaced fractures of the right lateral third through seventh ribs, with associated small right apical pneumothorax. Underlying chronic right-sided rib deformities are also seen. Mild left basilar atelectasis or scarring is noted.  The cardiomediastinal silhouette is borderline normal in size. The right shoulder is  grossly unremarkable in appearance. Clips are noted overlying the right upper quadrant.  IMPRESSION: Displaced fractures of the right lateral third through seventh ribs, with associated small right apical pneumothorax. Mild left basilar atelectasis or scarring noted.  These results were called by telephone at the time of interpretation on 03/31/2015 at 9:25 pm to Dr. Blanchie Dessert, who verbally acknowledged these results.   Electronically Signed   By: Garald Balding M.D.   On: 03/31/2015 21:25   Dg Shoulder Right  03/31/2015   CLINICAL DATA:  81 year old female with a history of fall  EXAM: RIGHT SHOULDER - 2+ VIEW  COMPARISON:  09/07/2006  FINDINGS: Osteopenia.  Sequential rib fractures involving ribs , incompletely imaged on the shoulder plain film. There is associated right pneumothorax.  No evidence of acute fracture of the proximal right humerus. Glenohumeral joint appears congruent. No clavicle fracture identified. No scapular fracture identified.  IMPRESSION: No acute fracture of the right shoulder identified.  There are sequential fractures of the right third through tenth ribs. This is incompletely imaged on the current study.  Right-sided pneumothorax.  These results were called by telephone at the time of interpretation on 03/31/2015 at 9:26 pm to Dr. Blanchie Dessert , who verbally acknowledged these results.  Signed,  Dulcy Fanny. Earleen Newport, DO  [facility]  [facility]   Electronically Signed   By: Corrie Mckusick D.O.   On: 03/31/2015 21:28    Review of Systems  All other systems reviewed and are negative.   Blood pressure 174/63, pulse 56, temperature 98 F (36.7 C), temperature source Oral, resp. rate 18, height $RemoveBe'5\' 3"'SjLBMFYgq$  (1.6 m), weight 58.06 kg (128 lb), SpO2 98 %. Physical Exam  Constitutional: She is oriented to person, place, and time. She appears well-developed and well-nourished. No distress.  HENT:  Head: Normocephalic and atraumatic.  Right  Ear: External ear normal.  Left Ear: External ear normal.  Nose: Nose normal.  Eyes: Conjunctivae are normal. Pupils are equal, round, and reactive to light. No scleral icterus.  Neck: Normal range of motion. Neck supple. No tracheal deviation present.  Cervical spine is nontender  Cardiovascular: Normal rate and normal heart sounds.   No murmur heard. Irregular rhythm  Respiratory: Effort normal and breath sounds normal. She exhibits tenderness.  GI: Soft. She exhibits no distension. There is no tenderness. There is no guarding.  Musculoskeletal: Normal range of  motion. She exhibits no edema or tenderness.  Neurological: She is alert and oriented to person, place, and time.  Skin: Skin is warm. No rash noted. She is not diaphoretic. No erythema.  Psychiatric: Her behavior is normal. Judgment normal.     Assessment/Plan Multiple right rib fractures with small apical pneumothorax status post fall  Given her age, she will be admitted to the intensive care area. She does have fractures of ribs 3 through approximately 10. Some of these may be old and from other falls. Currently, the apical pneumothorax is tiny. We will hold on chest tube insertion and less it increases in size or she develops shortness of breath. I explained to the patient's family reasonings for admission to the intensive care unit and potential for worsening pulmonary status given her age.`  Larrie Fraizer A 04/01/2015, 12:52 AM   Procedures 32

## 2015-04-01 NOTE — Evaluation (Signed)
Physical Therapy Evaluation Patient Details Name: [name] MRN: [id number] DOB: 04-27-1920 Today's Date: 04/01/2015   History of Present Illness  pt presents after fall while taking out the garbage sustaining R 3-10 rib fxs and small R PTX.    Clinical Impression  Pt seems nervous during mobility and indicates pain in R ribs and shoulder during mobility.  Pt seems as though she will have good family support at home and plans to stay with her son at D/C.  Pt has all needed DME and will be able to D/C with HHPT.  Will continue to follow while on acute.      Follow Up Recommendations Home health PT;Supervision/Assistance - 24 hour    Equipment Recommendations  None recommended by PT    Recommendations for Other Services       Precautions / Restrictions Precautions Precautions: Fall Restrictions Weight Bearing Restrictions: No      Mobility  Bed Mobility Overal bed mobility: Needs Assistance Bed Mobility: Supine to Sit     Supine to sit: Min assist     General bed mobility comments: A with bringing trunk up to sitting.  pt then able to scoot self to EOB.    Transfers Overall transfer level: Needs assistance Equipment used: 1 person hand held assist Transfers: Sit to/from [employer] Sit to Stand: Min assist Stand pivot transfers: Min assist       General transfer comment: cues for use of UEs and movement through pivot to chair.    Ambulation/Gait                Stairs            Wheelchair Mobility    Modified Rankin (Stroke Patients Only)       Balance Overall balance assessment: Needs assistance;History of Falls Sitting-balance support: Single extremity supported;Feet supported Sitting balance-Leahy Scale: Poor Sitting balance - Comments: Seems to need UE support more for pain relief than balance.     Standing balance support: During functional activity Standing balance-Leahy Scale: Poor                                Pertinent Vitals/Pain Pain Assessment: Faces Faces Pain Scale: Hurts little more Pain Location: pt indicates pain in R ribs and R shoulder during mobility, but would not rate when asked states "it's bearable".   Pain Descriptors / Indicators: Tightness;Sore Pain Intervention(s): Monitored during session;Premedicated before session;Repositioned    Home Living Family/patient expects to be discharged to:: Private residence Living Arrangements: Children (pt to stay at son's home.  ) Available Help at Discharge: Family;Available 24 hours/day Type of Home: House Home Access: Stairs to enter Entrance Stairs-Rails: Right (Rail at back steps, none at the front. ) Secretary/administrator of Steps: 4-5 Home Layout: Two level;Able to live on main level with bedroom/bathroom Home Equipment: Dan Humphreys - 2 wheels;Cane - single point;Tub bench;Toilet riser      Prior Function Level of Independence: Independent with assistive device(s)               Hand Dominance        Extremity/Trunk Assessment   Upper Extremity Assessment: RUE deficits/detail RUE Deficits / Details: R shoulder pain since fall limiting strength and ROM.   RUE: Unable to fully assess due to pain       Lower Extremity Assessment: Generalized weakness      Cervical / Trunk Assessment: Kyphotic  Communication   Communication: No difficulties  Cognition Arousal/Alertness: Awake/alert Behavior During Therapy: WFL for tasks assessed/performed Overall Cognitive Status: Within Functional Limits for tasks assessed                      General Comments      Exercises        Assessment/Plan    PT Assessment Patient needs continued PT services  PT Diagnosis Difficulty walking;Acute pain   PT Problem List Decreased strength;Decreased activity tolerance;Decreased balance;Decreased mobility;Decreased knowledge of use of DME;Pain  PT Treatment Interventions DME instruction;Gait training;Stair  training;Functional mobility training;Therapeutic activities;Therapeutic exercise;Balance training;Patient/family education   PT Goals (Current goals can be found in the Care Plan section) Acute Rehab PT Goals Patient Stated Goal: Home soon.   PT Goal Formulation: With patient Time For Goal Achievement: 04/08/15 Potential to Achieve Goals: Good    Frequency Min 3X/week   Barriers to discharge        Co-evaluation               End of Session   Activity Tolerance: No increased pain Patient left: in chair;with call bell/phone within reach Nurse Communication: Mobility status         Time: 1433-1500 PT Time Calculation (min) (ACUTE ONLY): 27 min   Charges:   PT Evaluation $Initial PT Evaluation Tier I: 1 Procedure PT Treatments $Therapeutic Activity: 8-22 mins   PT G CodesSunny Schlein,  [phone] 04/01/2015, 3:18 PM

## 2015-04-01 NOTE — Progress Notes (Signed)
CXR today shows maybe a small right apical PTX, but patient able to get IS up to 500, sats are good.  Breath sounds are equal.  Will start PT.  Marta Lamas. Gae Bon, MD, FACS [phone] Trauma Surgeon

## 2015-04-01 NOTE — Clinical Social Work Note (Signed)
Clinical Social Work Assessment  Patient Details  Name: [name] MRN: [id number] Date of Birth: 08-14-1920  Date of referral:  04/01/15               Reason for consult:  Discharge Planning, Trauma                Permission sought to share information with:  Family Supports Permission granted to share information::  Yes, Verbal Permission Granted  Name::     Burnetta Sabin   [phone]   Housing/Transportation Living arrangements for the past 2 months:  [place] of Information:  Patient Patient Interpreter Needed:  None Criminal Activity/Legal Involvement Pertinent to Current Situation/Hospitalization:  No - Comment as needed Significant Relationships:  Adult Children, [place] Lives with:  Self Do you feel safe going back to the place where you live?  Yes Need for family participation in patient care:  Yes (Comment) (Patient plans to stay with son at discharge)  Care giving concerns:  No family currently present at bedside, however patient states that family is supportive.  Per patient, patient son requesting home health at discharge.   Social Worker assessment / plan:  CSW met with patient at bedside to offer support and discuss patient discharge plans.  Patient states that she lives home alone and was walking the trash to the curb when she lost her balance and fell into the ditch.  A neighbor driving by saw patient and assisted her into the house and then to the hospital.  Patient plans to stay at her son's house following discharge.  Patient states that her son and/or family will be present in and out of the house.  Patient is agreeable with home health at discharge - CSW to notify CM pending PT/OT evaluations.  CSW remains available for support as needed.  SBIRT completed and no resources needed.  Employment status:  Retired Forensic scientist:  Medicare PT Recommendations:  Not assessed at this time [place] / Referral to community resources:  [place], SBIRT  Patient/Family's Response to care:  Patient states that she is very comfortable and appreciative of all staff interaction.  Patient motivated to return home with family and not emotionally ready to discuss the possibility of rehab.  CSW to follow up with patient following PT recommendations to ensure a safe discharge.  Patient/Family's Understanding of and Emotional Response to Diagnosis, Current Treatment, and Prognosis:  Patient is able to identify MD and recollect conversation regarding her diagnosis and needs following discharge.  Patient with supportive family, however not currently present at bedside - patient states that they have been updated.  Patient accepting of the fall and her continued risk for falls at home.  Patient verbalizes her desire to continue to remain as independent as possible.  Emotional Assessment Appearance:  Appears younger than stated age Attitude/Demeanor/Rapport:    Affect (typically observed):  Accepting, Adaptable, Happy, Hopeful, Appropriate, Calm Orientation:  Oriented to Self, Oriented to Place, Oriented to  Time, Oriented to Situation Alcohol / Substance use:  Never Used Psych involvement (Current and /or in the community):  No (Comment)  Discharge Needs  Concerns to be addressed:  Discharge Planning Concerns Readmission within the last 30 days:  No Current discharge risk:  Physical Impairment, Lives alone Barriers to Discharge:  Continued Medical Work up   [employer], [place]

## 2015-04-01 NOTE — Progress Notes (Signed)
UR completed.  Jadon Harbaugh, RN BSN MHA CCM Trauma/Neuro ICU Case Manager [phone]  

## 2015-04-01 NOTE — ED Notes (Signed)
Patient arrives via ems from [facility] with fractured ribs, 3-10 on right side as well as rt pneumothorax. Pt alert, oriented, respirations equal and unlabored, nad.

## 2015-04-02 ENCOUNTER — Inpatient Hospital Stay (HOSPITAL_COMMUNITY): Payer: Medicare Other

## 2015-04-02 LAB — CBC WITH DIFFERENTIAL/PLATELET
Basophils Absolute: 0 10*3/uL (ref 0.0–0.1)
Basophils Relative: 0 % (ref 0–1)
Eosinophils Absolute: 0 10*3/uL (ref 0.0–0.7)
Eosinophils Relative: 0 % (ref 0–5)
HCT: 34.2 % — ABNORMAL LOW (ref 36.0–46.0)
Hemoglobin: 11 g/dL — ABNORMAL LOW (ref 12.0–15.0)
LYMPHS ABS: 1.7 10*3/uL (ref 0.7–4.0)
Lymphocytes Relative: 17 % (ref 12–46)
MCH: 27.8 pg (ref 26.0–34.0)
MCHC: 32.2 g/dL (ref 30.0–36.0)
MCV: 86.4 fL (ref 78.0–100.0)
MONO ABS: 0.9 10*3/uL (ref 0.1–1.0)
Monocytes Relative: 9 % (ref 3–12)
NEUTROS ABS: 7.4 10*3/uL (ref 1.7–7.7)
NEUTROS PCT: 74 % (ref 43–77)
PLATELETS: 215 10*3/uL (ref 150–400)
RBC: 3.96 MIL/uL (ref 3.87–5.11)
RDW: 14.8 % (ref 11.5–15.5)
WBC: 10 10*3/uL (ref 4.0–10.5)

## 2015-04-02 MED ORDER — HYDROCODONE-ACETAMINOPHEN 5-325 MG PO TABS
1.0000 | ORAL_TABLET | Freq: Four times a day (QID) | ORAL | Status: DC | PRN
Start: 2015-04-02 — End: 2015-04-03
  Administered 2015-04-02: 2 via ORAL
  Filled 2015-04-02: qty 2

## 2015-04-02 MED ORDER — TRAMADOL HCL 50 MG PO TABS: 50.0000 mg | ORAL_TABLET | Freq: Four times a day (QID) | ORAL | Status: DC | PRN

## 2015-04-02 MED ORDER — HYDROCODONE-ACETAMINOPHEN 5-325 MG PO TABS: 1.0000 | ORAL_TABLET | ORAL | Status: DC | PRN

## 2015-04-02 NOTE — Progress Notes (Signed)
Transferred pt to 6V89 with no incident.

## 2015-04-02 NOTE — Progress Notes (Signed)
Physical Therapy Treatment Patient Details Name: [name] MRN: [id number] DOB: December 20, 1919 Today's Date: 04/02/2015    History of Present Illness pt presents after fall while taking out the garbage sustaining R 3-10 rib fxs and small R PTX.      PT Comments    Pt mobilizing well considering the Rib pain.  Gait is stable, general mobility with little to no assist.  Pt should be safe in home environment given her plans to go to son's home.   Follow Up Recommendations  Home health PT;Supervision/Assistance - 24 hour     Equipment Recommendations  None recommended by PT    Recommendations for Other Services       Precautions / Restrictions Precautions Precautions: Fall    Mobility  Bed Mobility Overal bed mobility: Needs Assistance Bed Mobility: Supine to Sit     Supine to sit: Min assist     General bed mobility comments: A with bringing trunk up to sitting.  pt then able to scoot self to EOB.    Transfers Overall transfer level: Needs assistance Equipment used: 1 person hand held assist Transfers: Sit to/from Stand Sit to Stand: Min assist         General transfer comment: cues for use of UEs and movement through pivot to chair.    Ambulation/Gait Ambulation/Gait assistance: Supervision Ambulation Distance (Feet): 300 Feet Assistive device: Rolling walker (2 wheeled) Gait Pattern/deviations: Step-through pattern Gait velocity: slower   General Gait Details: generally steady throughout.   Stairs            Wheelchair Mobility    Modified Rankin (Stroke Patients Only)       Balance Overall balance assessment: Needs assistance Sitting-balance support: No upper extremity supported Sitting balance-Leahy Scale: Fair       Standing balance-Leahy Scale: Poor                      Cognition Arousal/Alertness: Awake/alert Behavior During Therapy: WFL for tasks assessed/performed Overall Cognitive Status: Within Functional Limits  for tasks assessed                      Exercises      General Comments General comments (skin integrity, edema, etc.): SpO2 maintained in lower 90's % throughout      Pertinent Vitals/Pain Pain Assessment: 0-10 Pain Score: 5  Pain Location: ribs Pain Descriptors / Indicators: Aching;Constant Pain Intervention(s): Monitored during session    Home Living                      Prior Function            PT Goals (current goals can now be found in the care plan section) Acute Rehab PT Goals Patient Stated Goal: Home soon.   PT Goal Formulation: With patient Time For Goal Achievement: 04/08/15 Potential to Achieve Goals: Good Progress towards PT goals: Progressing toward goals    Frequency  Min 3X/week    PT Plan Current plan remains appropriate    Co-evaluation             End of Session   Activity Tolerance: Patient tolerated treatment well;No increased pain Patient left: in chair;with call bell/phone within reach     Time: 1410-1435 PT Time Calculation (min) (ACUTE ONLY): 25 min  Charges:  $Gait Training: 8-22 mins $Therapeutic Activity: 8-22 mins  G Codes:      Ashton Belote, Eliseo Gum 04/02/2015, 4:48 PM 04/02/2015  [place], PT [phone]  (pager)

## 2015-04-02 NOTE — Progress Notes (Signed)
Patient ID: [name], female   DOB: 1920/05/11, 81 y.o.   MRN: [id number]    Subjective: R rib pain, eating, has been working on IS  Objective: Vital signs in last 24 hours: Temp:  [97.2 F (36.2 C)-98.4 F (36.9 C)] 97.2 F (36.2 C) (04/26 0800) Pulse Rate:  [46-62] 59 (04/26 0800) Resp:  [13-30] 20 (04/26 0800) BP: (92-176)/(29-126) 151/65 mmHg (04/26 0800) SpO2:  [93 %-100 %] 95 % (04/26 0800) Last BM Date: 03/31/15  Intake/Output from previous day: 04/25 0701 - 04/26 0700 In: 2280 [P.O.:1080; I.V.:1200] Out: 550 [Urine:550] Intake/Output this shift: Total I/O In: 50 [I.V.:50] Out: 200 [Urine:200]  General appearance: alert and cooperative Resp: clear to auscultation bilaterally Chest wall: right sided chest wall tenderness Cardio: regular rate and rhythm GI: soft, NT, ND Extremities: calves soft  Lab Results: CBC   Recent Labs  04/01/15 0815 04/02/15 0240  WBC 10.0 10.0  HGB 11.4* 11.0*  HCT 34.7* 34.2*  PLT 233 215   BMET  Recent Labs  03/31/15 2215 04/01/15 0815  NA 139 133*  K 4.2 4.1  CL 106 99  CO2 24 26  GLUCOSE 189* 159*  BUN 18 14  CREATININE 0.88 0.90  CALCIUM 9.2 8.7   Anti-infectives: Anti-infectives    None      Assessment/Plan: Fall R rib FX 3-10 with trace PTX - pulmonary toilet, only doing 500 on IS, check CXR now  FEN - tol diet HTN - lisinopril and norvasc Dispo - floor, PT/OT   LOS: 1 day    Violeta Gelinas, MD, MPH, FACS Trauma: [phone] General Surgery: [phone]  04/02/2015

## 2015-04-03 DIAGNOSIS — D62 Acute posthemorrhagic anemia: Secondary | ICD-10-CM | POA: Diagnosis not present

## 2015-04-03 DIAGNOSIS — W19XXXA Unspecified fall, initial encounter: Secondary | ICD-10-CM | POA: Diagnosis present

## 2015-04-03 MED ORDER — HYDROCODONE-ACETAMINOPHEN 5-325 MG PO TABS: 1.0000 | ORAL_TABLET | ORAL | Status: DC | PRN

## 2015-04-03 NOTE — Clinical Social Work Note (Signed)
Clinical Social Worker facilitated patient discharge including contacting patient family and facility to confirm patient discharge plans.  Clinical information faxed to facility and family agreeable with plan.  CSW arranged ambulance transport via [facility] to [facility].  RN to call report prior to discharge.  Clinical Social Worker will sign off for now as social work intervention is no longer needed. Please consult us again if new need arises.  Jesse Haillee Johann, LCSW [phone] 

## 2015-04-03 NOTE — Clinical Social Work Placement (Signed)
   CLINICAL SOCIAL WORK PLACEMENT  NOTE  Date:  04/03/2015  Patient Details  Name: [name] MRN: [id number] Date of Birth: [birth year]  Clinical Social Work is seeking post-discharge placement for this patient at the Skilled  Nursing Facility level of care (*CSW will initial, date and re-position this form in  chart as items are completed):  Yes   Patient/family provided with [place] Clinical Social Work Department's list of facilities offering this level of care within the geographic area requested by the patient (or if unable, by the patient's family).  Yes   Patient/family informed of their freedom to choose among providers that offer the needed level of care, that participate in Medicare, Medicaid or managed care program needed by the patient, have an available bed and are willing to accept the patient.  Yes   Patient/family informed of 's ownership interest in [facility] and [facility], as well as of the fact that they are under no obligation to receive care at these facilities.  PASRR submitted to EDS on 04/03/15     PASRR number received on 04/03/15     Existing PASRR number confirmed on       FL2 transmitted to all facilities in geographic area requested by pt/family on 04/03/15     FL2 transmitted to all facilities within larger geographic area on       Patient informed that his/her managed care company has contracts with or will negotiate with certain facilities, including the following:        Yes   Patient/family informed of bed offers received.  Patient chooses bed at [facility]     Physician recommends and patient chooses bed at      Patient to be transferred to [facility] on 04/03/15.  Patient to be transferred to facility by Ambulance     Patient family notified on 04/03/15 of transfer.  Name of family member notified:  Jaree Nasir (son) at bedside     PHYSICIAN       Additional Comment:    Macario Golds,  LCSW [phone]

## 2015-04-03 NOTE — Progress Notes (Addendum)
  Pt will be staying with her son for 2-3 weeks immediately after discharge.  His name is Viriginia Chambless and his address: [address] [place], [place], [place] [address].  His phone is [phone].  When she is strong enough to be safe at her own home, she will go back there and the address and phone listed in EPIC is correct.  DME will need to deliver to the address above for pt's son.  She needs a hospital bed and a 3-in-1 BSC.  I have provided a list of HH agencies and Private Duty care agencies to son at his request.  Medicare IM (Important Message) delivered to patient today by me in anticipation of discharge.   Carlyle Lipa, RN BSN MHA CCM  Case Manager, Trauma Service/Unit 23M [phone]  *ADDENDUM 1606pm--Pt reassessed by PTdue to concerns of pt's abilities today as compared to yesterday.  Will not have 24hr supervision at son's home. At pt and son's request, CSW sent FL2 to [facility]  They responded that pt didn't meet Medicare criteria for admission but could come as private pay. Pt and son accepting of this at a cost of $255/day and a 5-day minimum.  CSW has assembled the packet and son has gone to complete the paperwork.

## 2015-04-03 NOTE — Progress Notes (Signed)
Report called to [facility] and given to Tucker. Patient in route.

## 2015-04-03 NOTE — Progress Notes (Signed)
Patient ID: [name], female   DOB: 1920-05-24, 81 y.o.   MRN: [id number]   LOS: 2 days   Subjective: Doing well, no new c/o.   Objective: Vital signs in last 24 hours: Temp:  [97.2 F (36.2 C)-98.8 F (37.1 C)] 98.4 F (36.9 C) (04/27 0600) Pulse Rate:  [50-70] 50 (04/27 0600) Resp:  [18-27] 18 (04/27 0600) BP: (107-190)/(41-66) 134/50 mmHg (04/27 0600) SpO2:  [95 %-100 %] 97 % (04/27 0600) Last BM Date: 04/01/15   IS: (=)   Physical Exam General appearance: alert and no distress Resp: clear to auscultation bilaterally Cardio: regular rate and rhythm   Assessment/Plan: Fall R rib FX 3-10 with trace PTX - pulmonary toilet ABL anemia -- Mild Multiple medical problems - Home meds FEN - tol diet VTE -- SCD's, Lovenox Dispo - Home    Freeman Caldron, PA-C Pager: [phone] General Trauma PA Pager: [phone]  04/03/2015

## 2015-04-03 NOTE — Discharge Summary (Signed)
Physician Discharge Summary  Patient ID: [name] MRN: [id number] DOB/AGE: 79-Nov-1921 79 y.o.  Admit date: 03/31/2015 Discharge date: 04/03/2015  Discharge Diagnoses Patient Active Problem List   Diagnosis Date Noted  . Fall 04/03/2015  . Acute blood loss anemia 04/03/2015  . Multiple rib fractures 04/01/2015  . GERD with stricture 11/23/2011  . HYPERLIPIDEMIA 03/07/2010  . HYPERTENSION 03/07/2010    Consultants None   Procedures None   HPI: [name] presented status post a fall. She was transferred from [facility]. She fell taking out the garbage. She landed on her right side. She presented complaining of right-sided chest pain. She denied shortness of breath. She was found to have multiple right rib fractures and a small right apical pneumothorax so was transferred for trauma care. She denied loss of consciousness, headache, or neck pain. After arrival at [facility], she still denied shortness of breath.She was admitted to the trauma ICU.   Hospital Course: The patient did not suffer any respiratory compromise while in the hospital. Her pneumothorax remained stable and her pain was controlled on oral medications. She had a mild acute blood loss anemia that did not require transfusion. She was mobilized with physical therapy who recommended discharge home with 24-hour supervision (which her son could provide) and home health therapy. She was discharged in good condition.      Medication List    STOP taking these medications        cephALEXin 500 MG capsule  Commonly known as:  KEFLEX      TAKE these medications        amLODipine 2.5 MG tablet  Commonly known as:  NORVASC  Take 2.5 mg by mouth daily.     aspirin EC 81 MG tablet  Take 81 mg by mouth daily.     co-enzyme Q-10 30 MG capsule  Take 30 mg by mouth daily.     HYDROcodone-acetaminophen 5-325 MG per tablet  Commonly known as:  NORCO  Take 1-2 tablets by mouth every 4 (four) hours as needed.      lisinopril 10 MG tablet  Commonly known as:  PRINIVIL,ZESTRIL  Take 10 mg by mouth 2 (two) times daily.     omeprazole 20 MG capsule  Commonly known as:  PRILOSEC  Take 20 mg by mouth daily.     oxybutynin 5 MG tablet  Commonly known as:  DITROPAN  Take 5 mg by mouth 2 (two) times daily after a meal. For bladder     Plant Sterols and Stanols 450 MG Tabs  Take 1 tablet by mouth 2 (two) times daily. Cholest-off            Follow-up Information    Schedule an appointment as soon as possible for a visit with RECORD,CHARLES.   Specialty:  Family Medicine   Contact information:   [address] [place] [address] [phone]       Call [facility].   Why:  As needed   Contact information:   [address] [place] [address] [phone]       Signed: Freeman Caldron, PA-C Pager: [phone] General Trauma PA Pager: [phone] 04/03/2015, 7:33 AM

## 2015-04-03 NOTE — Progress Notes (Signed)
Attempted numerous times to call [facility] to give report;  facility had very poor phone connection. Will try again later.

## 2015-04-03 NOTE — Clinical Social Work Note (Signed)
Clinical Social Worker continuing to follow patient and family for support and discharge planning needs.  CSW spoke with patient and patient son at bedside to confirm patient discharge plans.  Patient son confirms that patient will return home with him for 2-3 weeks and then transition home.  CM to arrange home health needs prior to discharge.  Clinical Social Worker will sign off for now as social work intervention is no longer needed. Please consult [place] again if new need arises.  Macario Golds, [place] [phone]

## 2015-04-03 NOTE — Progress Notes (Addendum)
Physical Therapy Treatment Patient Details Name: [name] MRN: [id number] DOB: 03/12/1920 Today's Date: 04/03/2015    History of Present Illness pt presents after fall while taking out the garbage sustaining R 3-10 rib fxs and small R PTX.      PT Comments    Pt with noted decrease in ambulation tolerance this date compared to yesterday. Pt reports increased pain and fatigue compared to yesterday. Pt's son present for treatment and instructed on how to physically assist pt on stairs and during ambulation without causing increased pain to patient. Pt's son with good return demonstration. Spoke extensively with son regarding d/c planning. Son reports there will be hours during the day that patient would be by herself. Currently pt is unsafe to transfer or complete ADLs of dressing/bathing independently due to increased falls risk and limited use of R UE due to increase flank pain from broken ribs. Unless family can provide 24/7 supervision pt will need ST-SNF placement to allow for progression to safe mod I level of function.   Follow Up Recommendations  Supervision/Assistance - 24 hour;SNF      Equipment Recommendations  None recommended by PT    Recommendations for Other Services       Precautions / Restrictions Precautions Precautions: Fall Restrictions Weight Bearing Restrictions: No    Mobility  Bed Mobility Overal bed mobility: Needs Assistance Bed Mobility: Supine to Sit     Supine to sit: Mod assist     General bed mobility comments: assist for trunk elevation and rolling due to R rib pain. instructed on using pillow for bracing R side  Transfers Overall transfer level: Needs assistance Equipment used: Rolling walker (2 wheeled) Transfers: Sit to/from Stand Sit to Stand: Min assist         General transfer comment: v/c's for safe hand placement, minA to steady pt  Ambulation/Gait Ambulation/Gait assistance: Min guard Ambulation Distance (Feet): 75  Feet Assistive device: Rolling walker (2 wheeled) Gait Pattern/deviations: Step-through pattern;Decreased stride length Gait velocity: slower   General Gait Details: pt with increased pain this date and increased difficulty standing upright. pt fatigued much quicker today compared to yesterday. pt reports increased pain compared to yesterday   Stairs Stairs: Yes Stairs assistance: Min assist Stair Management: One rail Left;Step to pattern Number of Stairs: 5 General stair comments: significant increase in time. instructed son on how to assist pt on stair without hurting patient via placing hand under L shoulder and R hip. instructed to go down stairs sideways leading with L foot and pt's son assisting at hips.  Wheelchair Mobility    Modified Rankin (Stroke Patients Only)       Balance Overall balance assessment: Needs assistance         Standing balance support: During functional activity Standing balance-Leahy Scale: Poor                      Cognition Arousal/Alertness: Awake/alert Behavior During Therapy: WFL for tasks assessed/performed Overall Cognitive Status: Within Functional Limits for tasks assessed                      Exercises      General Comments        Pertinent Vitals/Pain Pain Assessment: 0-10 Pain Score: 8  Pain Location: R ribs Pain Descriptors / Indicators: Aching;Constant Pain Intervention(s): Monitored during session    Home Living  Prior Function            PT Goals (current goals can now be found in the care plan section) Progress towards PT goals: Progressing toward goals    Frequency  Min 3X/week    PT Plan Current plan remains appropriate    Co-evaluation             End of Session   Activity Tolerance: No increased pain;Patient limited by fatigue Patient left: in chair;with call bell/phone within reach;with family/visitor present     Time: 1326-1405 PT Time  Calculation (min) (ACUTE ONLY): 39 min  Charges:  $Gait Training: 23-37 mins $Therapeutic Activity: 8-22 mins                    G Codes:      Marcene Brawn 04/03/2015, 3:05 PM   Lewis Shock, PT, DPT Pager #: [phone] Office #: [phone]

## 2015-04-04 ENCOUNTER — Encounter: Payer: Self-pay | Admitting: Adult Health

## 2015-04-04 ENCOUNTER — Non-Acute Institutional Stay (SKILLED_NURSING_FACILITY): Payer: Medicare Other | Admitting: Adult Health

## 2015-04-04 DIAGNOSIS — S2241XS Multiple fractures of ribs, right side, sequela: Secondary | ICD-10-CM | POA: Diagnosis not present

## 2015-04-04 DIAGNOSIS — E871 Hypo-osmolality and hyponatremia: Secondary | ICD-10-CM

## 2015-04-04 DIAGNOSIS — K219 Gastro-esophageal reflux disease without esophagitis: Secondary | ICD-10-CM

## 2015-04-04 DIAGNOSIS — I10 Essential (primary) hypertension: Secondary | ICD-10-CM | POA: Diagnosis not present

## 2015-04-04 DIAGNOSIS — K222 Esophageal obstruction: Secondary | ICD-10-CM

## 2015-04-04 DIAGNOSIS — D62 Acute posthemorrhagic anemia: Secondary | ICD-10-CM | POA: Diagnosis not present

## 2015-04-04 DIAGNOSIS — N3281 Overactive bladder: Secondary | ICD-10-CM
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
## 2015-04-05 ENCOUNTER — Non-Acute Institutional Stay (SKILLED_NURSING_FACILITY): Payer: Medicare Other | Admitting: Internal Medicine

## 2015-04-05 DIAGNOSIS — K219 Gastro-esophageal reflux disease without esophagitis: Secondary | ICD-10-CM

## 2015-04-05 DIAGNOSIS — S2241XS Multiple fractures of ribs, right side, sequela: Secondary | ICD-10-CM | POA: Diagnosis not present

## 2015-04-05 DIAGNOSIS — K222 Esophageal obstruction: Secondary | ICD-10-CM

## 2015-04-05 DIAGNOSIS — I10 Essential (primary) hypertension: Secondary | ICD-10-CM | POA: Diagnosis not present

## 2015-04-05 DIAGNOSIS — R5381 Other malaise: Secondary | ICD-10-CM | POA: Diagnosis not present

## 2015-04-05 DIAGNOSIS — D62 Acute posthemorrhagic anemia: Secondary | ICD-10-CM

## 2015-04-05 DIAGNOSIS — K5901 Slow transit constipation: Secondary | ICD-10-CM | POA: Diagnosis not present

## 2015-04-05 DIAGNOSIS — N3281 Overactive bladder: Secondary | ICD-10-CM

## 2015-04-05 NOTE — Progress Notes (Signed)
Patient ID: [name], female   DOB: 19-Jan-1920, 81 y.o.   MRN: [id number]     [facility]   PCP: RECORD,CHARLES  Code Status: full code  Allergies  Allergen Reactions  . Amoxicillin Itching    Chief Complaint  Patient presents with  . New Admit To SNF     HPI:  81 year old patient is here for short term rehabilitation post hospital admission from 03/31/15-04/03/15 post fall with multiple right rib fractures and chest pain. She was also noted to have right apical small pneumothorax. No surgical intervention were performed. She did not require any blood transfusion. SNF was recommended by therapy team. She is seen in her room today. She complaints of pain with movement and deep breathing. Otherwise it is controlled. As per staff her bp has been elevated. She has been straining and has not had a bowel movement in the facility. No other concerns. She has PMH of HTN, HLD, GERD  Review of Systems:  Constitutional: positive for fatigue. Negative for fever, chills HENT: Negative for headache, congestion, nasal discharge Eyes: Negative for eye pain, blurred vision, double vision and discharge.  Respiratory: Negative for cough, shortness of breath and wheezing.   Cardiovascular: Negative for chest pain, palpitations, leg swelling.  Gastrointestinal: Negative for heartburn, nausea, vomiting, abdominal pain. Has consitpation. appetite is good Genitourinary: Negative for dysuria Musculoskeletal: Negative for back pain. No falls in facility Skin: Negative for itching, rash.  Neurological: Negative for dizziness, tingling, focal weakness Psychiatric/Behavioral: Negative for depression   Past Medical History  Diagnosis Date  . HTN (hypertension)   . GERD (gastroesophageal reflux disease)   . S/P dilatation of esophageal stricture   . HLD (hyperlipidemia)   . Hiatal hernia   . Thyroid disorder   . Arthritis   . Atrial fibrillation    Past Surgical  History  Procedure Laterality Date  . Cataract extraction    . Cholecystectomy    . Total hip arthroplasty    . Tonsillectomy    . Colonoscopy  01/18/2007    tortuous or redundant colon  . Upper gastrointestinal endoscopy  03/12/2010, 11/26/2011    esophageal stricture dilations, hiatal hernia  . Appendectomy     Social History:   reports that she has never smoked. She has never used smokeless tobacco. She reports that she does not drink alcohol or use illicit drugs.  Family History  Problem Relation Age of Onset  . Diabetes Brother   . Heart disease Father   . Colon cancer Neg Hx   . Esophageal cancer Neg Hx   . Stomach cancer Neg Hx     Medications: Patient's Medications  New Prescriptions   No medications on file  Previous Medications   AMLODIPINE (NORVASC) 2.5 MG TABLET    Take 2.5 mg by mouth daily.     ASPIRIN EC 81 MG TABLET    Take 81 mg by mouth daily.     CO-ENZYME Q-10 30 MG CAPSULE    Take 30 mg by mouth daily.     HYDROCODONE-ACETAMINOPHEN (NORCO) 5-325 MG PER TABLET    Take 1-2 tablets by mouth every 4 (four) hours as needed.   LISINOPRIL (PRINIVIL,ZESTRIL) 10 MG TABLET    Take 10 mg by mouth 2 (two) times daily.     OMEPRAZOLE (PRILOSEC) 20 MG CAPSULE    Take 20 mg by mouth daily.   OXYBUTYNIN (DITROPAN) 5 MG TABLET    Take 5 mg by mouth  2 (two) times daily after a meal. For bladder   PLANT STEROLS AND STANOLS 450 MG TABS    Take 1 tablet by mouth 2 (two) times daily. Cholest-off  Modified Medications   No medications on file  Discontinued Medications   No medications on file     Physical Exam: Filed Vitals:   04/05/15 1432  BP: 164/60  Pulse: 60  Temp: 97.4 F (36.3 C)  Resp: 18  Height:  (1.626 m)  Weight: 130 lb 6.4 oz (59.149 kg)  SpO2: 96%    General- elderly female, well built, in no acute distress Head- normocephalic, atraumatic Throat- moist mucus membrane Eyes- PERRLA, EOMI, no pallor, no icterus, no discharge, normal conjunctiva,  normal sclera Neck- no cervical lymphadenopathy Chest- bruise on right lateral side of chest and tenderness on palpation Cardiovascular- normal s1,s2, no murmurs, no leg edema Respiratory- bilateral clear to auscultation, no wheeze, no rhonchi, no crackles, no use of accessory muscles Abdomen- bowel sounds present, soft, non tender Musculoskeletal- able to move all 4 extremities, generalized weakness  Neurological- no focal deficit Skin- warm and dry Psychiatry- alert and oriented to person, place and time, normal mood and affect   Labs reviewed: Basic Metabolic Panel:  Recent Labs  04/54/09 2215 04/01/15 0815  NA 139 133*  K 4.2 4.1  CL 106 99  CO2 24 26  GLUCOSE 189* 159*  BUN 18 14  CREATININE 0.88 0.90  CALCIUM 9.2 8.7   Liver Function Tests: No results for input(s): AST, ALT, ALKPHOS, BILITOT, PROT, ALBUMIN in the last 8760 hours. No results for input(s): LIPASE, AMYLASE in the last 8760 hours. No results for input(s): AMMONIA in the last 8760 hours. CBC:  Recent Labs  03/31/15 2215 04/01/15 0815 04/02/15 0240  WBC 15.3* 10.0 10.0  NEUTROABS 13.8*  --  7.4  HGB 12.7 11.4* 11.0*  HCT 38.4 34.7* 34.2*  MCV 86.3 84.6 86.4  PLT 255 233 215   Cardiac Enzymes:  Recent Labs  04/01/15 0259 04/01/15 0815 04/01/15 1410  TROPONINI <0.03 <0.03 <0.03     Assessment/Plan  Physical deconditioning Will have her work with physical therapy and occupational therapy team to help with gait training and muscle strengthening exercises.fall precautions. Skin care. Encourage to be out of bed.   Multiple right rib fractures On Norco 5/325 mg 1-2 tabs q4h prn for pain but has not been asking for it as per staff. She is in pain. Will d/c norco for now. Have her on tylenol 1000 mg bid and tramadol 50 mg every 6 hour prn for breakthrough pain.   Anemia Likely form blood loss. Monitor h&h  Hypertension bp readings elevated. On amlodipine 2.5 mg daily and lisinopril 10 mg  bid. Change amlodipine to 5 mg daily and change lisinopril to 20 mg daily. Check bp q shift. Will have holding parameters with both medications  Constipation Add colace 100 mg bid and miralax daily for now, reassess  GERD  continue Prilosec 20 mg daily  Overactive bladder continue oxybutynin 5 mg bid  Goals of care: short term rehabilitation but possible long term care   Labs/tests ordered: cbc  Family/ staff Communication: reviewed care plan with patient and nursing supervisor    Oneal Grout, MD  [facility] [phone] (Monday-Friday 8 am - 5 pm) [phone] (afterhours)

## 2015-04-09 ENCOUNTER — Non-Acute Institutional Stay (SKILLED_NURSING_FACILITY): Payer: Medicare Other | Admitting: Adult Health

## 2015-04-09 ENCOUNTER — Encounter: Payer: Self-pay | Admitting: Adult Health

## 2015-04-09 DIAGNOSIS — E43 Unspecified severe protein-calorie malnutrition: Secondary | ICD-10-CM | POA: Diagnosis not present

## 2015-04-09 DIAGNOSIS — E871 Hypo-osmolality and hyponatremia: Secondary | ICD-10-CM | POA: Diagnosis not present

## 2015-04-09 DIAGNOSIS — K219 Gastro-esophageal reflux disease without esophagitis: Secondary | ICD-10-CM | POA: Diagnosis not present

## 2015-04-09 DIAGNOSIS — N3281 Overactive bladder: Secondary | ICD-10-CM

## 2015-04-09 DIAGNOSIS — K222 Esophageal obstruction: Secondary | ICD-10-CM | POA: Diagnosis not present

## 2015-04-09 DIAGNOSIS — I10 Essential (primary) hypertension: Secondary | ICD-10-CM | POA: Diagnosis not present

## 2015-04-09 DIAGNOSIS — D62 Acute posthemorrhagic anemia: Secondary | ICD-10-CM

## 2015-04-09 DIAGNOSIS — S2241XS Multiple fractures of ribs, right side, sequela: Secondary | ICD-10-CM | POA: Diagnosis not present
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
## 2015-05-26 ENCOUNTER — Emergency Department (HOSPITAL_BASED_OUTPATIENT_CLINIC_OR_DEPARTMENT_OTHER): Payer: Medicare Other

## 2015-05-26 ENCOUNTER — Encounter (HOSPITAL_BASED_OUTPATIENT_CLINIC_OR_DEPARTMENT_OTHER): Payer: Self-pay | Admitting: Emergency Medicine

## 2015-05-26 ENCOUNTER — Emergency Department (HOSPITAL_BASED_OUTPATIENT_CLINIC_OR_DEPARTMENT_OTHER)
Admission: EM | Admit: 2015-05-26 | Discharge: 2015-05-26 | Disposition: A | Discharge status: Home / Self Care | Payer: Medicare Other | Attending: Emergency Medicine | Admitting: Emergency Medicine

## 2015-05-26 DIAGNOSIS — S61419A Laceration without foreign body of unspecified hand, initial encounter: Secondary | ICD-10-CM

## 2015-05-26 DIAGNOSIS — S61402A Unspecified open wound of left hand, initial encounter: Secondary | ICD-10-CM | POA: Insufficient documentation

## 2015-05-26 DIAGNOSIS — Z79899 Other long term (current) drug therapy: Secondary | ICD-10-CM | POA: Diagnosis not present

## 2015-05-26 DIAGNOSIS — M1990 Unspecified osteoarthritis, unspecified site: Secondary | ICD-10-CM | POA: Diagnosis not present

## 2015-05-26 DIAGNOSIS — S0990XA Unspecified injury of head, initial encounter: Secondary | ICD-10-CM

## 2015-05-26 DIAGNOSIS — Z880 Allergy status to penicillin: Secondary | ICD-10-CM | POA: Diagnosis not present

## 2015-05-26 DIAGNOSIS — Z9049 Acquired absence of other specified parts of digestive tract: Secondary | ICD-10-CM | POA: Insufficient documentation

## 2015-05-26 DIAGNOSIS — S61401A Unspecified open wound of right hand, initial encounter: Secondary | ICD-10-CM | POA: Diagnosis not present

## 2015-05-26 DIAGNOSIS — I4891 Unspecified atrial fibrillation: Secondary | ICD-10-CM | POA: Insufficient documentation

## 2015-05-26 DIAGNOSIS — S41002A Unspecified open wound of left shoulder, initial encounter: Secondary | ICD-10-CM | POA: Diagnosis not present

## 2015-05-26 DIAGNOSIS — W19XXXA Unspecified fall, initial encounter: Secondary | ICD-10-CM

## 2015-05-26 DIAGNOSIS — Y9389 Activity, other specified: Secondary | ICD-10-CM | POA: Insufficient documentation

## 2015-05-26 DIAGNOSIS — S51819A Laceration without foreign body of unspecified forearm, initial encounter: Secondary | ICD-10-CM

## 2015-05-26 DIAGNOSIS — W01198A Fall on same level from slipping, tripping and stumbling with subsequent striking against other object, initial encounter: Secondary | ICD-10-CM | POA: Diagnosis not present

## 2015-05-26 DIAGNOSIS — S0101XA Laceration without foreign body of scalp, initial encounter: Secondary | ICD-10-CM | POA: Diagnosis not present

## 2015-05-26 DIAGNOSIS — Y998 Other external cause status: Secondary | ICD-10-CM | POA: Diagnosis not present

## 2015-05-26 DIAGNOSIS — K219 Gastro-esophageal reflux disease without esophagitis: Secondary | ICD-10-CM | POA: Insufficient documentation

## 2015-05-26 DIAGNOSIS — I10 Essential (primary) hypertension: Secondary | ICD-10-CM | POA: Insufficient documentation

## 2015-05-26 DIAGNOSIS — Y9289 Other specified places as the place of occurrence of the external cause: Secondary | ICD-10-CM | POA: Insufficient documentation

## 2015-05-26 DIAGNOSIS — Z7982 Long term (current) use of aspirin: Secondary | ICD-10-CM | POA: Insufficient documentation

## 2015-05-26 DIAGNOSIS — T148XXA Other injury of unspecified body region, initial encounter: Secondary | ICD-10-CM

## 2015-05-26 DIAGNOSIS — Z9889 Other specified postprocedural states: Secondary | ICD-10-CM | POA: Insufficient documentation

## 2015-05-26 DIAGNOSIS — Z8639 Personal history of other endocrine, nutritional and metabolic disease: Secondary | ICD-10-CM | POA: Diagnosis not present

## 2015-05-26 MED ORDER — LIDOCAINE HCL 2 % IJ SOLN
10.0000 mL | Freq: Once | INTRAMUSCULAR | Status: AC
  Administered 2015-05-26: 200 mg
  Filled 2015-05-26: qty 20

## 2015-05-26 NOTE — ED Notes (Signed)
Patient transported to X-ray 

## 2015-05-26 NOTE — ED Notes (Signed)
I completed wound care per NP Pickering. Steri-strips to skin tears on her hand, arms, and elbow. I covered with sterile 2x2's and kerlix wraps,I  secured all with tape. For wounds that had no skin surface remaining for steri-strip use, I applied heavy application of bacitracin to non-adherent pads cut to fit, then I applied paper tape and medipore tape to secure. I completed a head wrap with large kerlix over several 2x2's covering stapled wound. Head wrap secure, patient ready for discharge.

## 2015-05-26 NOTE — ED Notes (Signed)
Patient transported to CT 

## 2015-05-26 NOTE — Discharge Instructions (Signed)
Have the sutures removed  Head Injury You have received a head injury. It does not appear serious at this time. Headaches and vomiting are common following head injury. It should be easy to awaken from sleeping. Sometimes it is necessary for you to stay in the emergency department for a while for observation. Sometimes admission to the hospital may be needed. After injuries such as yours, most problems occur within the first 24 hours, but side effects may occur up to 7-10 days after the injury. It is important for you to carefully monitor your condition and contact your health care provider or seek immediate medical care if there is a change in your condition. WHAT ARE THE TYPES OF HEAD INJURIES? Head injuries can be as minor as a bump. Some head injuries can be more severe. More severe head injuries include:  A jarring injury to the brain (concussion).  A bruise of the brain (contusion). This mean there is bleeding in the brain that can cause swelling.  A cracked skull (skull fracture).  Bleeding in the brain that collects, clots, and forms a bump (hematoma). WHAT CAUSES A HEAD INJURY? A serious head injury is most likely to happen to someone who is in a car wreck and is not wearing a seat belt. Other causes of major head injuries include bicycle or motorcycle accidents, sports injuries, and falls. HOW ARE HEAD INJURIES DIAGNOSED? A complete history of the event leading to the injury and your current symptoms will be helpful in diagnosing head injuries. Many times, pictures of the brain, such as CT or MRI are needed to see the extent of the injury. Often, an overnight hospital stay is necessary for observation.  WHEN SHOULD I SEEK IMMEDIATE MEDICAL CARE?  You should get help right away if:  You have confusion or drowsiness.  You feel sick to your stomach (nauseous) or have continued, forceful vomiting.  You have dizziness or unsteadiness that is getting worse.  You have severe, continued  headaches not relieved by medicine. Only take over-the-counter or prescription medicines for pain, fever, or discomfort as directed by your health care provider.  You do not have normal function of the arms or legs or are unable to walk.  You notice changes in the black spots in the center of the colored part of your eye (pupil).  You have a clear or bloody fluid coming from your nose or ears.  You have a loss of vision. During the next 24 hours after the injury, you must stay with someone who can watch you for the warning signs. This person should contact local emergency services (911 in the [place] if you have seizures, you become unconscious, or you are unable to wake up. HOW CAN I PREVENT A HEAD INJURY IN THE FUTURE? The most important factor for preventing major head injuries is avoiding motor vehicle accidents. To minimize the potential for damage to your head, it is crucial to wear seat belts while riding in motor vehicles. Wearing helmets while bike riding and playing collision sports (like football) is also helpful. Also, avoiding dangerous activities around the house will further help reduce your risk of head injury.  WHEN CAN I RETURN TO NORMAL ACTIVITIES AND ATHLETICS? You should be reevaluated by your health care provider before returning to these activities. If you have any of the following symptoms, you should not return to activities or contact sports until 1 week after the symptoms have stopped:  Persistent headache.  Dizziness or vertigo.  Poor attention  and concentration.  Confusion.  Memory problems.  Nausea or vomiting.  Fatigue or tire easily.  Irritability.  Intolerant of bright lights or loud noises.  Anxiety or depression.  Disturbed sleep. MAKE SURE YOU:   Understand these instructions.  Will watch your condition.  Will get help right away if you are not doing well or get worse. Document Released: 11/23/2005 Document Revised: 11/28/2013 Document  Reviewed: 07/31/2013 [facility] Patient Information 2015 [place], [place]. This information is not intended to replace advice given to you by your health care provider. Make sure you discuss any questions you have with your health care provider.  Stitches, Staples, or Skin Adhesive Strips  Stitches (sutures), staples, and skin adhesive strips hold the skin together as it heals. They will usually be in place for 7 days or less. HOME CARE  Wash your hands with soap and water before and after you touch your wound.  Only take medicine as told by your doctor.  Cover your wound only if your doctor told you to. Otherwise, leave it open to air.  Do not get your stitches wet or dirty. If they get dirty, dab them gently with a clean washcloth. Wet the washcloth with soapy water. Do not rub. Pat them dry gently.  Do not put medicine or medicated cream on your stitches unless your doctor told you to.  Do not take out your own stitches or staples. Skin adhesive strips will fall off by themselves.  Do not pick at the wound. Picking can cause an infection.  Do not miss your follow-up appointment.  If you have problems or questions, call your doctor. GET HELP RIGHT AWAY IF:   You have a temperature by mouth above 102 F (38.9 C), not controlled by medicine.  You have chills.  You have redness or pain around your stitches.  There is puffiness (swelling) around your stitches.  You notice fluid (drainage) from your stitches.  There is a bad smell coming from your wound. MAKE SURE YOU:  Understand these instructions.  Will watch your condition.  Will get help if you are not doing well or get worse. Document Released: 09/20/2009 Document Revised: 02/15/2012 Document Reviewed: 09/20/2009 [facility] Patient Information 2015 [place], [place]. This information is not intended to replace advice given to you by your health care provider. Make sure you discuss any questions you have with your health care  provider.

## 2015-05-26 NOTE — ED Notes (Signed)
Pt back from imaging, alert, spontaneous, NAD, calm, interactive, resps e/u, speaking in clear complete sentences, no dyspnea noted, no active bleeding noted, head dressing CDI, family x2 at [facility], suture cart at [facility].

## 2015-05-26 NOTE — ED Notes (Signed)
Patient states that she fell and got tangled up with her walker. Patient has bruising to her forehead and right sided head laceration.

## 2015-05-26 NOTE — ED Provider Notes (Signed)
CSN: [id number]     Arrival date & time 05/26/15  2007 History   First MD Initiated Contact with Patient 05/26/15 2015     Chief Complaint  Patient presents with  . Head Injury     (Consider location/radiation/quality/duration/timing/severity/associated sxs/prior Treatment) HPI Comments: Pt comes in with c/o laceration to the scalp after a fall today. Pt states that she was coming in from outside and her walker got caught on the door ledge and she fell. No loc or dizziness is associated with fall. Pt states that he has a history of mechanical falls. She states that she was able to get up after the fall. She denies hurting anywhere. Family member states that she noticed swelling to the right hip. Pt states that it doesn't hurt and she was able to put wt on the area.  The history is provided by the patient. No language interpreter was used.    Past Medical History  Diagnosis Date  . HTN (hypertension)   . GERD (gastroesophageal reflux disease)   . S/P dilatation of esophageal stricture   . HLD (hyperlipidemia)   . Hiatal hernia   . Thyroid disorder   . Arthritis   . Atrial fibrillation    Past Surgical History  Procedure Laterality Date  . Cataract extraction    . Cholecystectomy    . Total hip arthroplasty    . Tonsillectomy    . Colonoscopy  01/18/2007    tortuous or redundant colon  . Upper gastrointestinal endoscopy  03/12/2010, 11/26/2011    esophageal stricture dilations, hiatal hernia  . Appendectomy     Family History  Problem Relation Age of Onset  . Diabetes Brother   . Heart disease Father   . Colon cancer Neg Hx   . Esophageal cancer Neg Hx   . Stomach cancer Neg Hx    History  Substance Use Topics  . Smoking status: Never Smoker   . Smokeless tobacco: Never Used  . Alcohol Use: No   OB History    No data available     Review of Systems  All other systems reviewed and are negative.     Allergies  Amoxicillin  Home Medications   Prior to  Admission medications   Medication Sig Start Date End Date Taking? Authorizing Provider  amLODipine (NORVASC) 2.5 MG tablet Take 2.5 mg by mouth daily.      Historical Provider, MD  aspirin EC 81 MG tablet Take 81 mg by mouth daily.      Historical Provider, MD  co-enzyme Q-10 30 MG capsule Take 30 mg by mouth daily.      Historical Provider, MD  HYDROcodone-acetaminophen (NORCO) 5-325 MG per tablet Take 1-2 tablets by mouth every 4 (four) hours as needed. 04/03/15   Freeman Caldron, PA-C  lisinopril (PRINIVIL,ZESTRIL) 10 MG tablet Take 10 mg by mouth 2 (two) times daily.      Historical Provider, MD  omeprazole (PRILOSEC) 20 MG capsule Take 20 mg by mouth daily.    Historical Provider, MD  oxybutynin (DITROPAN) 5 MG tablet Take 5 mg by mouth 2 (two) times daily after a meal. For bladder 01/24/15   Historical Provider, MD  Plant Sterols and Stanols 450 MG TABS Take 1 tablet by mouth 2 (two) times daily. Cholest-off    Historical Provider, MD   BP 111/50 mmHg  Pulse 92  Temp(Src) 97.6 F (36.4 C) (Oral)  Resp 18  Ht 5' (1.524 m)  Wt 122 lb (55.339 kg)  BMI 23.83 kg/m2  SpO2 100% Physical Exam  Constitutional: She is oriented to person, place, and time. She appears well-developed and well-nourished.  HENT:  Right Ear: External ear normal.  Left Ear: External ear normal.  Mouth/Throat: Oropharynx is clear and moist.  Hematoma to the right forehead  Eyes: Conjunctivae and EOM are normal. Pupils are equal, round, and reactive to light.  Neck: Normal range of motion. Neck supple.  Cardiovascular: Normal rate and regular rhythm.   Pulmonary/Chest: Effort normal and breath sounds normal.  Abdominal: Soft. Bowel sounds are normal. There is no tenderness.  Musculoskeletal:  Obvious swelling noted to the right lateral hip. No shortening or rotation noted. Full rom  Neurological: She is alert and oriented to person, place, and time. Coordination normal.  Skin:  Laceration noted to the right  posterior scalp. Multiple skin tears noted to bilateral upper extremities. She has skin tear to the left posterior shoulder  Nursing note and vitals reviewed.   ED Course  LACERATION REPAIR Date/Time: 05/26/2015 10:29 PM Performed by: Teressa Lower Authorized by: Teressa Lower Risks and benefits: risks, benefits and alternatives were discussed Consent given by: patient Patient identity confirmed: verbally with patient Time out: Immediately prior to procedure a "time out" was called to verify the correct patient, procedure, equipment, support staff and site/side marked as required. Body area: head/neck Location details: scalp Laceration length: 4 cm Foreign bodies: no foreign bodies Anesthesia: local infiltration Local anesthetic: lidocaine 2% without epinephrine Irrigation solution: saline Skin closure: staples Technique: simple Approximation: close Approximation difficulty: simple Patient tolerance: Patient tolerated the procedure well with no immediate complications   (including critical care time) Labs Review Labs Reviewed - No data to display  Imaging Review Ct Head Wo Contrast  05/26/2015   CLINICAL DATA:  Pain following fall  EXAM: CT HEAD WITHOUT CONTRAST  CT CERVICAL SPINE WITHOUT CONTRAST  TECHNIQUE: Multidetector CT imaging of the head and cervical spine was performed following the standard protocol without intravenous contrast. Multiplanar CT image reconstructions of the cervical spine were also generated.  COMPARISON:  Head CT October 01, 2013  FINDINGS: CT HEAD FINDINGS  There is mild diffuse atrophy with moderate parietal lobe atrophy bilaterally, stable. There is no intracranial mass, hemorrhage, extra-axial fluid collection, or midline shift. There is slight small vessel disease in the centra semiovale bilaterally. Gray-white compartments otherwise are normal. There is a medial right frontal scalp hematoma. Bony calvarium appears intact. The mastoid air cells  are clear.  CT CERVICAL SPINE FINDINGS  There is no fracture. There is 3 mm of anterolisthesis of C4 on C5. There is minimal anterolisthesis of C7 on T1. These areas of spondylolisthesis are felt to be due to underlying spondylosis. Prevertebral soft tissues and predental space regions are normal. There is extensive cystic change in the odontoid, felt to be due to chronic arthropathy. Dear is facet hypertrophy to varying degrees at all levels. These changes are marked on the right at C3-4 and on the right at C5-6. There is severe exit foraminal narrowing at these levels. There is moderate exit foraminal narrowing due to bony hypertrophy at C6-7 bilaterally, C5-6 on the left, and C4-5 on the left. There is no frank disc extrusion or stenosis. There are foci of carotid artery calcification bilaterally.  IMPRESSION: CT head: Atrophy with slight periventricular small vessel disease. No intracranial mass, hemorrhage, or extra-axial fluid collection. No acute infarct. Medial right frontal scalp hematoma. No fracture apparent.  CT cervical spine: Extensive multilevel arthropathy. Areas of  mild spondylolisthesis are felt to be due to underlying spondylosis. No apparent fracture.   Electronically Signed   By: Bretta Bang III M.D.   On: 05/26/2015 21:20   Ct Cervical Spine Wo Contrast  05/26/2015   CLINICAL DATA:  Pain following fall  EXAM: CT HEAD WITHOUT CONTRAST  CT CERVICAL SPINE WITHOUT CONTRAST  TECHNIQUE: Multidetector CT imaging of the head and cervical spine was performed following the standard protocol without intravenous contrast. Multiplanar CT image reconstructions of the cervical spine were also generated.  COMPARISON:  Head CT October 01, 2013  FINDINGS: CT HEAD FINDINGS  There is mild diffuse atrophy with moderate parietal lobe atrophy bilaterally, stable. There is no intracranial mass, hemorrhage, extra-axial fluid collection, or midline shift. There is slight small vessel disease in the centra  semiovale bilaterally. Gray-white compartments otherwise are normal. There is a medial right frontal scalp hematoma. Bony calvarium appears intact. The mastoid air cells are clear.  CT CERVICAL SPINE FINDINGS  There is no fracture. There is 3 mm of anterolisthesis of C4 on C5. There is minimal anterolisthesis of C7 on T1. These areas of spondylolisthesis are felt to be due to underlying spondylosis. Prevertebral soft tissues and predental space regions are normal. There is extensive cystic change in the odontoid, felt to be due to chronic arthropathy. Dear is facet hypertrophy to varying degrees at all levels. These changes are marked on the right at C3-4 and on the right at C5-6. There is severe exit foraminal narrowing at these levels. There is moderate exit foraminal narrowing due to bony hypertrophy at C6-7 bilaterally, C5-6 on the left, and C4-5 on the left. There is no frank disc extrusion or stenosis. There are foci of carotid artery calcification bilaterally.  IMPRESSION: CT head: Atrophy with slight periventricular small vessel disease. No intracranial mass, hemorrhage, or extra-axial fluid collection. No acute infarct. Medial right frontal scalp hematoma. No fracture apparent.  CT cervical spine: Extensive multilevel arthropathy. Areas of mild spondylolisthesis are felt to be due to underlying spondylosis. No apparent fracture.   Electronically Signed   By: Bretta Bang III M.D.   On: 05/26/2015 21:20   Dg Hip Unilat With Pelvis 2-3 Views Right  05/26/2015   CLINICAL DATA:  Status post fall, with right hand bruising and swelling. Initial encounter.  EXAM: RIGHT HIP (WITH PELVIS) 2-3 VIEWS  COMPARISON:  None.  FINDINGS: There is no evidence of fracture or dislocation. The patient's right hip arthroplasty is grossly intact. Mild lucency about the acetabular component could reflect mild particle disease. There is no evidence of loosening.  Mild degenerative change is noted at the lower lumbar spine.  The left hip joint is grossly unremarkable. Diffuse vascular calcifications are seen.  The visualized bowel gas pattern is grossly unremarkable in appearance.  IMPRESSION: 1. No evidence of fracture or dislocation. 2. Right hip arthroplasty is unremarkable in appearance, aside from mild lucency about the acetabular component, possibly reflecting mild particle disease. No evidence of loosening.   Electronically Signed   By: Roanna Raider M.D.   On: 05/26/2015 21:37     EKG Interpretation None      MDM   Final diagnoses:  Fall  Head injury, initial encounter  Skin tear of hand without complication, unspecified laterality, initial encounter  Skin tear of forearm without complication, unspecified laterality, initial encounter  Scalp laceration, initial encounter  Hematoma    Skin tear repaired by nursing staff. Discussed wound care with pt and family. No bone or  brain abnormality noted. Pt is neurologically intact. Pt will have someone else with her today    Teressa Lower, NP 05/26/15 2332  Pricilla Loveless, MD 05/27/15 1505

## 2015-07-30 ENCOUNTER — Encounter: Payer: Self-pay | Admitting: Internal Medicine

## 2016-04-16 ENCOUNTER — Emergency Department (HOSPITAL_BASED_OUTPATIENT_CLINIC_OR_DEPARTMENT_OTHER): Payer: Medicare Other

## 2016-04-16 ENCOUNTER — Emergency Department (HOSPITAL_BASED_OUTPATIENT_CLINIC_OR_DEPARTMENT_OTHER)
Admission: EM | Admit: 2016-04-16 | Discharge: 2016-04-17 | Disposition: A | Discharge status: Home / Self Care | Payer: Medicare Other | Attending: Emergency Medicine | Admitting: Emergency Medicine

## 2016-04-16 ENCOUNTER — Encounter (HOSPITAL_BASED_OUTPATIENT_CLINIC_OR_DEPARTMENT_OTHER): Payer: Self-pay | Admitting: *Deleted

## 2016-04-16 DIAGNOSIS — R51 Headache: Secondary | ICD-10-CM | POA: Insufficient documentation

## 2016-04-16 DIAGNOSIS — E785 Hyperlipidemia, unspecified: Secondary | ICD-10-CM | POA: Diagnosis not present

## 2016-04-16 DIAGNOSIS — I4891 Unspecified atrial fibrillation: Secondary | ICD-10-CM | POA: Insufficient documentation

## 2016-04-16 DIAGNOSIS — Z79899 Other long term (current) drug therapy: Secondary | ICD-10-CM | POA: Diagnosis not present

## 2016-04-16 DIAGNOSIS — I10 Essential (primary) hypertension: Secondary | ICD-10-CM | POA: Insufficient documentation

## 2016-04-16 DIAGNOSIS — M1990 Unspecified osteoarthritis, unspecified site: Secondary | ICD-10-CM | POA: Insufficient documentation

## 2016-04-16 DIAGNOSIS — R030 Elevated blood-pressure reading, without diagnosis of hypertension: Secondary | ICD-10-CM

## 2016-04-16 DIAGNOSIS — IMO0001 Reserved for inherently not codable concepts without codable children: Secondary | ICD-10-CM

## 2016-04-16 DIAGNOSIS — R519 Headache, unspecified: Secondary | ICD-10-CM

## 2016-04-16 LAB — URINALYSIS, ROUTINE W REFLEX MICROSCOPIC
Bilirubin Urine: NEGATIVE
Glucose, UA: NEGATIVE mg/dL
HGB URINE DIPSTICK: NEGATIVE
Ketones, ur: NEGATIVE mg/dL
Leukocytes, UA: NEGATIVE
Nitrite: NEGATIVE
PROTEIN: NEGATIVE mg/dL
Specific Gravity, Urine: 1.007 (ref 1.005–1.030)
pH: 7 (ref 5.0–8.0)

## 2016-04-16 LAB — COMPREHENSIVE METABOLIC PANEL
ALT: 14 U/L (ref 14–54)
AST: 25 U/L (ref 15–41)
Albumin: 3.5 g/dL (ref 3.5–5.0)
Alkaline Phosphatase: 57 U/L (ref 38–126)
Anion gap: 4 — ABNORMAL LOW (ref 5–15)
BILIRUBIN TOTAL: 0.5 mg/dL (ref 0.3–1.2)
BUN: 26 mg/dL — AB (ref 6–20)
CO2: 29 mmol/L (ref 22–32)
CREATININE: 1.12 mg/dL — AB (ref 0.44–1.00)
Calcium: 9.3 mg/dL (ref 8.9–10.3)
Chloride: 105 mmol/L (ref 101–111)
GFR calc Af Amer: 48 mL/min — ABNORMAL LOW (ref >60)
GFR, EST NON AFRICAN AMERICAN: 41 mL/min — AB (ref >60)
Glucose, Bld: 125 mg/dL — ABNORMAL HIGH (ref 65–99)
Potassium: 4.1 mmol/L (ref 3.5–5.1)
Sodium: 138 mmol/L (ref 135–145)
TOTAL PROTEIN: 6.6 g/dL (ref 6.5–8.1)

## 2016-04-16 LAB — CBC WITH DIFFERENTIAL/PLATELET
BASOS ABS: 0 10*3/uL (ref 0.0–0.1)
Basophils Relative: 0 %
Eosinophils Absolute: 0 10*3/uL (ref 0.0–0.7)
Eosinophils Relative: 0 %
HCT: 37.4 % (ref 36.0–46.0)
Hemoglobin: 12.1 g/dL (ref 12.0–15.0)
LYMPHS PCT: 33 %
Lymphs Abs: 2.4 10*3/uL (ref 0.7–4.0)
MCH: 27.7 pg (ref 26.0–34.0)
MCHC: 32.4 g/dL (ref 30.0–36.0)
MCV: 85.6 fL (ref 78.0–100.0)
MONO ABS: 0.6 10*3/uL (ref 0.1–1.0)
Monocytes Relative: 8 %
Neutro Abs: 4.2 10*3/uL (ref 1.7–7.7)
Neutrophils Relative %: 59 %
Platelets: 228 10*3/uL (ref 150–400)
RBC: 4.37 MIL/uL (ref 3.87–5.11)
RDW: 14.8 % (ref 11.5–15.5)
WBC: 7.1 10*3/uL (ref 4.0–10.5)

## 2016-04-16 NOTE — ED Notes (Signed)
Pt up to bedside toilet.  

## 2016-04-16 NOTE — ED Notes (Signed)
Patient transported to CT 

## 2016-04-16 NOTE — ED Notes (Signed)
Pt c/o increased BP and h/a x 1 day  

## 2016-04-16 NOTE — ED Provider Notes (Signed)
CSN: [id number]     Arrival date & time 04/16/16  2136 History   First MD Initiated Contact with Patient 04/16/16 2215     Chief Complaint  Patient presents with  . Hypertension     (Consider location/radiation/quality/duration/timing/severity/associated sxs/prior Treatment) HPI  Blood pressure 185/64, pulse 50, temperature 97.8 F (36.6 C), temperature source Oral, resp. rate 18, height 5' (1.524 m), weight 52.164 kg, SpO2 99 %.  [name] is a 82 y.o. female complaining of waking up this morning and feeling generally weak, she also had a mild generalized headache which is atypical for her, she took her blood pressure this morning it was elevated at 150/90, she continue to check it throughout the day and continue to elevate. She feels like her vision is blurred, she denies numbness, weakness, dysarthria, ataxia, chest pain, shortness of breath, abdominal pain, nausea, vomiting, change in bowel or bladder habits. She's taking her hypertension medication as prescribed and has been eating and drinking normally.  Past Medical History  Diagnosis Date  . HTN (hypertension)   . GERD (gastroesophageal reflux disease)   . S/P dilatation of esophageal stricture   . HLD (hyperlipidemia)   . Hiatal hernia   . Thyroid disorder   . Arthritis   . Atrial fibrillation [facility])    Past Surgical History  Procedure Laterality Date  . Cataract extraction    . Cholecystectomy    . Total hip arthroplasty    . Tonsillectomy    . Colonoscopy  01/18/2007    tortuous or redundant colon  . Upper gastrointestinal endoscopy  03/12/2010, 11/26/2011    esophageal stricture dilations, hiatal hernia  . Appendectomy     Family History  Problem Relation Age of Onset  . Diabetes Brother   . Heart disease Father   . Colon cancer Neg Hx   . Esophageal cancer Neg Hx   . Stomach cancer Neg Hx    Social History  Substance Use Topics  . Smoking status: Never Smoker   . Smokeless tobacco: Never Used  .  Alcohol Use: No   OB History    No data available     Review of Systems  10 systems reviewed and found to be negative, except as noted in the HPI.   Allergies  Amoxicillin  Home Medications   Prior to Admission medications   Medication Sig Start Date End Date Taking? Authorizing Provider  amLODipine (NORVASC) 2.5 MG tablet Take 2.5 mg by mouth daily.      Historical Provider, MD  aspirin EC 81 MG tablet Take 81 mg by mouth daily.      Historical Provider, MD  co-enzyme Q-10 30 MG capsule Take 30 mg by mouth daily.      Historical Provider, MD  HYDROcodone-acetaminophen (NORCO) 5-325 MG per tablet Take 1-2 tablets by mouth every 4 (four) hours as needed. 04/03/15   Freeman Caldron, PA-C  lisinopril (PRINIVIL,ZESTRIL) 10 MG tablet Take 10 mg by mouth 2 (two) times daily.      Historical Provider, MD  omeprazole (PRILOSEC) 20 MG capsule Take 20 mg by mouth daily.    Historical Provider, MD  oxybutynin (DITROPAN) 5 MG tablet Take 5 mg by mouth 2 (two) times daily after a meal. For bladder 01/24/15   Historical Provider, MD  Plant Sterols and Stanols 450 MG TABS Take 1 tablet by mouth 2 (two) times daily. Cholest-off    Historical Provider, MD   BP 185/64 mmHg  Pulse 50  Temp(Src) 97.8  F (36.6 C) (Oral)  Resp 18  Ht 5' (1.524 m)  Wt 52.164 kg  BMI 22.46 kg/m2  SpO2 99% Physical Exam  Constitutional: She is oriented to person, place, and time. She appears well-developed and well-nourished. No distress.  HENT:  Head: Normocephalic and atraumatic.  Mouth/Throat: Oropharynx is clear and moist.  Eyes: Conjunctivae and EOM are normal. Pupils are equal, round, and reactive to light.  Neck: Normal range of motion. No JVD present. No tracheal deviation present.  Cardiovascular: Normal rate, regular rhythm and intact distal pulses.   Radial pulse equal bilaterally  Pulmonary/Chest: Effort normal and breath sounds normal. No stridor. No respiratory distress. She has no wheezes. She has  no rales. She exhibits no tenderness.  Abdominal: Soft. She exhibits no distension and no mass. There is no tenderness. There is no rebound and no guarding.  Musculoskeletal: Normal range of motion. She exhibits no edema or tenderness.  No calf asymmetry, superficial collaterals, palpable cords, edema, Homans sign negative bilaterally.    Neurological: She is alert and oriented to person, place, and time.  Follows commands, Clear, goal oriented speech, Strength is 5 out of 5x4 extremities, patient ambulates with a coordinated in nonantalgic gait. Sensation is grossly intact.   Skin: Skin is warm. She is not diaphoretic.  Psychiatric: She has a normal mood and affect.  Nursing note and vitals reviewed.   ED Course  Procedures (including critical care time) Labs Review Labs Reviewed  CBC WITH DIFFERENTIAL/PLATELET  COMPREHENSIVE METABOLIC PANEL  URINALYSIS, ROUTINE W REFLEX MICROSCOPIC (NOT AT [facility])    Imaging Review No results found. I have personally reviewed and evaluated these images and lab results as part of my medical decision-making.   EKG Interpretation   Date/Time:  Thursday Apr 16 2016 22:27:08 EDT Ventricular Rate:  50 PR Interval:  175 QRS Duration: 96 QT Interval:  482 QTC Calculation: 440 R Axis:   38 Text Interpretation:  Sinus rhythm Probable anterior infarct, age  indeterminate Baseline wander in lead(s) I III aVL No significant change  since last tracing Confirmed by Lincoln Brigham [phone]) on 04/16/2016 10:36:18 PM      MDM   Final diagnoses:  Elevated blood pressure  Acute nonintractable headache, unspecified headache type    Filed Vitals:   04/16/16 2144 04/16/16 2228  BP: 177/71 185/64  Pulse: 54 50  Temp: 97.8 F (36.6 C)   TempSrc: Oral   Resp: 16 18  Height: 5' (1.524 m)   Weight: 52.164 kg   SpO2: 98% 99%    [name] is 82 y.o. female presenting with Headache, elevated blood pressure, she been compliant with her medication, neuro  exam is nonfocal. EKG with no acute changes, blood work reassuring, head CT negative, advised her to follow closely with PCP.  This is a shared visit with the attending physician who personally evaluated the patient and agrees with the care plan.   Evaluation does not show pathology that would require ongoing emergent intervention or inpatient treatment. Pt is hemodynamically stable and mentating appropriately. Discussed findings and plan with patient/guardian, who agrees with care plan. All questions answered. Return precautions discussed and outpatient follow up given.        Wynetta Emery, PA-C 04/17/16 0012  Tilden Fossa, MD 04/17/16 [phone]

## 2016-04-16 NOTE — ED Notes (Signed)
Pt placed on auto vitals Q30. Patient placed on cardiac monitor.  

## 2016-04-17 NOTE — Discharge Instructions (Signed)
Please follow with your primary care doctor in the next 2 days for a check-up. They must obtain records for further management.  ° °Do not hesitate to return to the Emergency Department for any new, worsening or concerning symptoms.  ° ° °Hypertension °Hypertension, commonly called high blood pressure, is when the force of blood pumping through your arteries is too strong. Your arteries are the blood vessels that carry blood from your heart throughout your body. A blood pressure reading consists of a higher number over a lower number, such as 110/72. The higher number (systolic) is the pressure inside your arteries when your heart pumps. The lower number (diastolic) is the pressure inside your arteries when your heart relaxes. Ideally you want your blood pressure below 120/80. °Hypertension forces your heart to work harder to pump blood. Your arteries may become narrow or stiff. Having untreated or uncontrolled hypertension can cause heart attack, stroke, kidney disease, and other problems. °RISK FACTORS °Some risk factors for high blood pressure are controllable. Others are not.  °Risk factors you cannot control include:  °· Race. You may be at higher risk if you are African American. °· Age. Risk increases with age. °· Gender. Men are at higher risk than women before age 45 years. After age 65, women are at higher risk than men. °Risk factors you can control include: °· Not getting enough exercise or physical activity. °· Being overweight. °· Getting too much fat, sugar, calories, or salt in your diet. °· Drinking too much alcohol. °SIGNS AND SYMPTOMS °Hypertension does not usually cause signs or symptoms. Extremely high blood pressure (hypertensive crisis) may cause headache, anxiety, shortness of breath, and nosebleed. °DIAGNOSIS °To check if you have hypertension, your health care provider will measure your blood pressure while you are seated, with your arm held at the level of your heart. It should be measured  at least twice using the same arm. Certain conditions can cause a difference in blood pressure between your right and left arms. A blood pressure reading that is higher than normal on one occasion does not mean that you need treatment. If it is not clear whether you have high blood pressure, you may be asked to return on a different day to have your blood pressure checked again. Or, you may be asked to monitor your blood pressure at home for 1 or more weeks. °TREATMENT °Treating high blood pressure includes making lifestyle changes and possibly taking medicine. Living a healthy lifestyle can help lower high blood pressure. You may need to change some of your habits. °Lifestyle changes may include: °· Following the DASH diet. This diet is high in fruits, vegetables, and whole grains. It is low in salt, red meat, and added sugars. °· Keep your sodium intake below 2,300 mg per day. °· Getting at least 30-45 minutes of aerobic exercise at least 4 times per week. °· Losing weight if necessary. °· Not smoking. °· Limiting alcoholic beverages. °· Learning ways to reduce stress. °Your health care provider may prescribe medicine if lifestyle changes are not enough to get your blood pressure under control, and if one of the following is true: °· You are 18-59 years of age and your systolic blood pressure is above 140. °· You are 60 years of age or older, and your systolic blood pressure is above 150. °· Your diastolic blood pressure is above 90. °· You have diabetes, and your systolic blood pressure is over 140 or your diastolic blood pressure is over 90. °·   You have kidney disease and your blood pressure is above 140/90. °· You have heart disease and your blood pressure is above 140/90. °Your personal target blood pressure may vary depending on your medical conditions, your age, and other factors. °HOME CARE INSTRUCTIONS °· Have your blood pressure rechecked as directed by your health care provider.   °· Take medicines only  as directed by your health care provider. Follow the directions carefully. Blood pressure medicines must be taken as prescribed. The medicine does not work as well when you skip doses. Skipping doses also puts you at risk for problems. °· Do not smoke.   °· Monitor your blood pressure at home as directed by your health care provider.  °SEEK MEDICAL CARE IF:  °· You think you are having a reaction to medicines taken. °· You have recurrent headaches or feel dizzy. °· You have swelling in your ankles. °· You have trouble with your vision. °SEEK IMMEDIATE MEDICAL CARE IF: °· You develop a severe headache or confusion. °· You have unusual weakness, numbness, or feel faint. °· You have severe chest or abdominal pain. °· You vomit repeatedly. °· You have trouble breathing. °MAKE SURE YOU:  °· Understand these instructions. °· Will watch your condition. °· Will get help right away if you are not doing well or get worse. °  °This information is not intended to replace advice given to you by your health care provider. Make sure you discuss any questions you have with your health care provider. °  °Document Released: 11/23/2005 Document Revised: 04/09/2015 Document Reviewed: 09/15/2013 °Elsevier Interactive Patient Education ©2016 [employer] ° °

## 2016-04-17 NOTE — ED Notes (Signed)
MD at bedside. 

## 2016-04-17 NOTE — ED Provider Notes (Signed)
Medical screening examination/treatment/procedure(s) were conducted as a shared visit with non-physician practitioner(s) and myself.  I personally evaluated the patient during the encounter.   EKG Interpretation   Date/Time:  Thursday Apr 16 2016 22:27:08 EDT Ventricular Rate:  50 PR Interval:  175 QRS Duration: 96 QT Interval:  482 QTC Calculation: 440 R Axis:   38 Text Interpretation:  Sinus rhythm Probable anterior infarct, age  indeterminate Baseline wander in lead(s) I III aVL No significant change  since last tracing Confirmed by Lincoln Brigham [phone]) on 04/16/2016 10:36:18 PM      12:07 AM Patient is awake and alert in no acute distress. Heart regular rate and rhythm no murmur heard. Patient and son advised that diagnostic studies were reassuring.  Nursing notes and vitals signs, including pulse oximetry, reviewed.  Summary of this visit's results, reviewed by myself:  Labs:  Results for orders placed or performed during the hospital encounter of 04/16/16 (from the past 24 hour(s))  CBC with Differential     Status: None   Collection Time: 04/16/16 11:00 PM  Result Value Ref Range   WBC 7.1 4.0 - 10.5 K/uL   RBC 4.37 3.87 - 5.11 MIL/uL   Hemoglobin 12.1 12.0 - 15.0 g/dL   HCT 40.9 81.1 - 91.4 %   MCV 85.6 78.0 - 100.0 fL   MCH 27.7 26.0 - 34.0 pg   MCHC 32.4 30.0 - 36.0 g/dL   RDW 78.2 95.6 - 21.3 %   Platelets 228 150 - 400 K/uL   Neutrophils Relative % 59 %   Neutro Abs 4.2 1.7 - 7.7 K/uL   Lymphocytes Relative 33 %   Lymphs Abs 2.4 0.7 - 4.0 K/uL   Monocytes Relative 8 %   Monocytes Absolute 0.6 0.1 - 1.0 K/uL   Eosinophils Relative 0 %   Eosinophils Absolute 0.0 0.0 - 0.7 K/uL   Basophils Relative 0 %   Basophils Absolute 0.0 0.0 - 0.1 K/uL  Comprehensive metabolic panel     Status: Abnormal   Collection Time: 04/16/16 11:00 PM  Result Value Ref Range   Sodium 138 135 - 145 mmol/L   Potassium 4.1 3.5 - 5.1 mmol/L   Chloride 105 101 - 111 mmol/L   CO2 29 22  - 32 mmol/L   Glucose, Bld 125 (H) 65 - 99 mg/dL   BUN 26 (H) 6 - 20 mg/dL   Creatinine, Ser 0.86 (H) 0.44 - 1.00 mg/dL   Calcium 9.3 8.9 - 57.8 mg/dL   Total Protein 6.6 6.5 - 8.1 g/dL   Albumin 3.5 3.5 - 5.0 g/dL   AST 25 15 - 41 U/L   ALT 14 14 - 54 U/L   Alkaline Phosphatase 57 38 - 126 U/L   Total Bilirubin 0.5 0.3 - 1.2 mg/dL   GFR calc non Af Amer 41 (L) >60 mL/min   GFR calc Af Amer 48 (L) >60 mL/min   Anion gap 4 (L) 5 - 15  Urinalysis, Routine w reflex microscopic (not at [facility])     Status: None   Collection Time: 04/16/16 11:16 PM  Result Value Ref Range   Color, Urine YELLOW YELLOW   APPearance CLEAR CLEAR   Specific Gravity, Urine 1.007 1.005 - 1.030   pH 7.0 5.0 - 8.0   Glucose, UA NEGATIVE NEGATIVE mg/dL   Hgb urine dipstick NEGATIVE NEGATIVE   Bilirubin Urine NEGATIVE NEGATIVE   Ketones, ur NEGATIVE NEGATIVE mg/dL   Protein, ur NEGATIVE NEGATIVE mg/dL   Nitrite  NEGATIVE NEGATIVE   Leukocytes, UA NEGATIVE NEGATIVE    Imaging Studies: Ct Head Wo Contrast  04/16/2016  CLINICAL DATA:  Hypertension and headache for 1 day. EXAM: CT HEAD WITHOUT CONTRAST TECHNIQUE: Contiguous axial images were obtained from the base of the skull through the vertex without intravenous contrast. COMPARISON:  10/01/2013 FINDINGS: There is no intracranial hemorrhage, mass or evidence of acute infarction. There is mild generalized atrophy. There is mild chronic microvascular ischemic change. There is no significant extra-axial fluid collection. No acute intracranial findings are evident. The calvarium and skullbase are intact. Visible paranasal sinuses and orbits are unremarkable. IMPRESSION: No acute intracranial findings. There is mild generalized atrophy and chronic appearing white matter hypodensities which likely represent small vessel ischemic disease. Electronically Signed   By: Ellery Plunk M.D.   On: 04/16/2016 23:06      Paula Libra, MD 04/17/16 [id number]

## 2017-02-20 ENCOUNTER — Encounter (HOSPITAL_BASED_OUTPATIENT_CLINIC_OR_DEPARTMENT_OTHER): Payer: Self-pay | Admitting: Emergency Medicine

## 2017-02-20 ENCOUNTER — Emergency Department (HOSPITAL_BASED_OUTPATIENT_CLINIC_OR_DEPARTMENT_OTHER): Payer: Medicare Other

## 2017-02-20 ENCOUNTER — Emergency Department (HOSPITAL_BASED_OUTPATIENT_CLINIC_OR_DEPARTMENT_OTHER)
Admission: EM | Admit: 2017-02-20 | Discharge: 2017-02-20 | Disposition: A | Discharge status: Home / Self Care | Payer: Medicare Other | Attending: Emergency Medicine | Admitting: Emergency Medicine

## 2017-02-20 DIAGNOSIS — Z79899 Other long term (current) drug therapy: Secondary | ICD-10-CM | POA: Diagnosis not present

## 2017-02-20 DIAGNOSIS — M7989 Other specified soft tissue disorders: Secondary | ICD-10-CM | POA: Diagnosis not present

## 2017-02-20 DIAGNOSIS — G8929 Other chronic pain: Secondary | ICD-10-CM | POA: Diagnosis not present

## 2017-02-20 DIAGNOSIS — Z7982 Long term (current) use of aspirin: Secondary | ICD-10-CM | POA: Insufficient documentation

## 2017-02-20 DIAGNOSIS — I10 Essential (primary) hypertension: Secondary | ICD-10-CM | POA: Diagnosis not present

## 2017-02-20 DIAGNOSIS — M25561 Pain in right knee: Secondary | ICD-10-CM | POA: Insufficient documentation

## 2017-02-20 NOTE — ED Provider Notes (Signed)
[facility] Provider Note   CSN: [id number] Arrival date & time: 02/20/17  1123     History   Chief Complaint Chief Complaint  Patient presents with  . foot swelling    HPI [name] is a 83 y.o. female.  Patient brought in by family member. Patient with complaint of right foot ankle swelling for 3 days. No significant pain. Does resolve with elevation. No chest pain no shortness of breath. No history of any injury. Patient also main complaint is pain around her knee but that's chronic she is known to have significant knee arthritis particularly on the right side. Frequently gets injections. No fevers. No difficulty ambulating despite his leg swelling.      Past Medical History:  Diagnosis Date  . Arthritis   . Atrial fibrillation (HCC)   . GERD (gastroesophageal reflux disease)   . Hiatal hernia   . HLD (hyperlipidemia)   . HTN (hypertension)   . S/P dilatation of esophageal stricture   . Thyroid disorder     Patient Active Problem List   Diagnosis Date Noted  . Fall 04/03/2015  . Acute blood loss anemia 04/03/2015  . Multiple rib fractures 04/01/2015  . GERD with stricture 11/23/2011  . HYPERLIPIDEMIA 03/07/2010  . Essential hypertension 03/07/2010    Past Surgical History:  Procedure Laterality Date  . APPENDECTOMY    . CATARACT EXTRACTION    . CHOLECYSTECTOMY    . COLONOSCOPY  01/18/2007   tortuous or redundant colon  . TONSILLECTOMY    . TOTAL HIP ARTHROPLASTY    . UPPER GASTROINTESTINAL ENDOSCOPY  03/12/2010, 11/26/2011   esophageal stricture dilations, hiatal hernia    OB History    No data available       Home Medications    Prior to Admission medications   Medication Sig Start Date End Date Taking? Authorizing Provider  amLODipine (NORVASC) 2.5 MG tablet Take 2.5 mg by mouth daily.     Yes Historical Provider, MD  aspirin EC 81 MG tablet Take 81 mg by mouth daily.     Yes Historical Provider, MD  lisinopril  (PRINIVIL,ZESTRIL) 10 MG tablet Take 10 mg by mouth 2 (two) times daily.     Yes Historical Provider, MD  omeprazole (PRILOSEC) 20 MG capsule Take 20 mg by mouth daily.   Yes Historical Provider, MD  oxybutynin (DITROPAN) 5 MG tablet Take 5 mg by mouth 2 (two) times daily after a meal. For bladder 01/24/15  Yes Historical Provider, MD  co-enzyme Q-10 30 MG capsule Take 30 mg by mouth daily.      Historical Provider, MD  HYDROcodone-acetaminophen (NORCO) 5-325 MG per tablet Take 1-2 tablets by mouth every 4 (four) hours as needed. 04/03/15   Freeman Caldron, PA-C  Plant Sterols and Stanols 450 MG TABS Take 1 tablet by mouth 2 (two) times daily. Cholest-off    Historical Provider, MD    Family History Family History  Problem Relation Age of Onset  . Diabetes Brother   . Heart disease Father   . Colon cancer Neg Hx   . Esophageal cancer Neg Hx   . Stomach cancer Neg Hx     Social History Social History  Substance Use Topics  . Smoking status: Never Smoker  . Smokeless tobacco: Never Used  . Alcohol use No     Allergies   Amoxicillin   Review of Systems Review of Systems  Constitutional: Negative for fever.  HENT: Negative for congestion.  Eyes: Negative for redness.  Respiratory: Negative for shortness of breath.   Cardiovascular: Positive for leg swelling. Negative for chest pain.  Gastrointestinal: Negative for abdominal pain.  Genitourinary: Negative for dysuria.  Musculoskeletal: Positive for arthralgias.  Skin: Negative for wound.  Neurological: Negative for headaches.  Hematological: Bruises/bleeds easily.  Psychiatric/Behavioral: Negative for confusion.     Physical Exam Updated Vital Signs BP (!) 190/81 (BP Location: Right Arm)   Pulse 62   Temp 97.6 F (36.4 C) (Oral)   Resp 18   Wt 53.5 kg   SpO2 100%   BMI 23.05 kg/m   Physical Exam  Constitutional: She is oriented to person, place, and time. She appears well-developed and well-nourished. No  distress.  HENT:  Head: Normocephalic and atraumatic.  Mouth/Throat: Oropharynx is clear and moist.  Eyes: Conjunctivae and EOM are normal. Pupils are equal, round, and reactive to light.  Neck: Normal range of motion. Neck supple.  Cardiovascular: Normal rate and regular rhythm.   Pulmonary/Chest: Effort normal and breath sounds normal.  Abdominal: Soft. Bowel sounds are normal.  Musculoskeletal: She exhibits edema.  Patient was scattered bruising on both legs. No open wounds. Family states it that's normal as her that way frequently. Some soft tissue swelling without pitting edema to the lateral aspect of both ankles right greater than left. Good cap refill to toes. Feet are warm. pain with range of motion of right knee. No effusion.   Neurological: She is alert and oriented to person, place, and time. No cranial nerve deficit or sensory deficit. She exhibits normal muscle tone. Coordination normal.  Skin: Skin is warm.  Nursing note and vitals reviewed.    ED Treatments / Results  Labs (all labs ordered are listed, but only abnormal results are displayed) Labs Reviewed - No data to display  EKG  EKG Interpretation None       Radiology Dg Tibia/fibula Right  Result Date: 02/20/2017 CLINICAL DATA:  Right lower extremity swelling for the past 3 days. EXAM: RIGHT TIBIA AND FIBULA - 2 VIEW COMPARISON:  Right knee radiographs obtained today, including the proximal portion of the lower leg. FINDINGS: Extensive arterial calcifications.  Unremarkable tibia and fibula. IMPRESSION: No acute abnormality. Extensive atheromatous arterial calcifications. Electronically Signed   By: Beckie Salts M.D.   On: 02/20/2017 16:16   Dg Ankle Complete Right  Result Date: 02/20/2017 CLINICAL DATA:  Right lower extremity swelling for 3 days. Some pain. EXAM: RIGHT ANKLE - COMPLETE 3+ VIEW COMPARISON:  None. FINDINGS: There is no evidence of fracture, dislocation, or joint effusion. Mild osteopenia.  Calcaneal spurs are present. Vascular calcifications are noted. Mild subcutaneous edema is present diffusely. IMPRESSION: Mild osteopenia. No evidence of right ankle fracture. Mild subcutaneous edema. Electronically Signed   By: Ted Mcalpine M.D.   On: 02/20/2017 16:14   US Venous Img Lower Unilateral Right  Result Date: 02/20/2017 CLINICAL DATA:  Right lower leg and foot swelling 3 days. No pain. No injury. EXAM: RIGHT LOWER EXTREMITY VENOUS DOPPLER ULTRASOUND TECHNIQUE: Gray-scale sonography with graded compression, as well as color Doppler and duplex ultrasound were performed to evaluate the lower extremity deep venous systems from the level of the common femoral vein and including the common femoral, femoral, profunda femoral, popliteal and calf veins including the posterior tibial, peroneal and gastrocnemius veins when visible. The superficial great saphenous vein was also interrogated. Spectral Doppler was utilized to evaluate flow at rest and with distal augmentation maneuvers in the common femoral, femoral and  popliteal veins. COMPARISON:  None. FINDINGS: Contralateral Common Femoral Vein: Respiratory phasicity is normal and symmetric with the symptomatic side. No evidence of thrombus. Normal compressibility. Common Femoral Vein: No evidence of thrombus. Normal compressibility, respiratory phasicity and response to augmentation. Saphenofemoral Junction: No evidence of thrombus. Normal compressibility and flow on color Doppler imaging. Profunda Femoral Vein: No evidence of thrombus. Normal compressibility and flow on color Doppler imaging. Femoral Vein: No evidence of thrombus. Normal compressibility, respiratory phasicity and response to augmentation. Popliteal Vein: Mild bulbous enlargement of the popliteal vein with somewhat slow flow although no thrombus. Normal compressibility, respiratory variation and augmentation. Calf Veins: No evidence of thrombus. Normal compressibility and flow on color  Doppler imaging. Superficial Great Saphenous Vein: No evidence of thrombus. Normal compressibility and flow on color Doppler imaging. Venous Reflux:  None. Other Findings:  Small Baker cyst measuring 0.7 x 1.3 x 2.8 cm. IMPRESSION: No evidence of deep venous thrombosis. Right-sided Baker cyst measuring 0.7 x 1.3 x 2.8 cm. Electronically Signed   By: Elberta Fortis M.D.   On: 02/20/2017 15:11   Dg Knee Complete 4 Views Right  Result Date: 02/20/2017 CLINICAL DATA:  Right lower extremity swelling for the past 3 days. EXAM: RIGHT KNEE - COMPLETE 4+ VIEW COMPARISON:  None. FINDINGS: Marked medial joint space narrowing. Spur formation involving all 3 joint compartments. No effusion. Extensive arterial calcifications. Proximal femoral fixation hardware. IMPRESSION: Tricompartmental degenerative changes, most pronounced involving the medial compartment. Electronically Signed   By: Beckie Salts M.D.   On: 02/20/2017 16:14   Dg Foot Complete Right  Result Date: 02/20/2017 CLINICAL DATA:  Right lower extremity swelling for the past 3 days. EXAM: RIGHT FOOT COMPLETE - 3+ VIEW COMPARISON:  None. FINDINGS: Moderate-sized calcaneal spurs. Arterial calcifications. Subcutaneous edema in the plantar aspect of the foot. IMPRESSION: Subcutaneous edema without acute underlying bony abnormality. Moderately large calcaneal spurs. Electronically Signed   By: Beckie Salts M.D.   On: 02/20/2017 16:15    Procedures Procedures (including critical care time)  Medications Ordered in ED Medications - No data to display   Initial Impression / Assessment and Plan / ED Course  I have reviewed the triage vital signs and the nursing notes.  Pertinent labs & imaging results that were available during my care of the patient were reviewed by me and considered in my medical decision making (see chart for details).     Patient without evidence of any traumatic event. No evidence of any DVTs. Soft tissue swelling is odd doesn't seem  to fit like congestive heart failure anything like that is more puffiness soft tissue swelling to the lateral aspect of both ankles. No real pitting edema. Feet are warm. No real edema to the leg. Patient does have a lot of chronic bruising. Patient has known chronic right knee pain. Patient stable for discharge home at this point in time. X-rays negative for any bony injury. Patient able to ambulate without pain good range of motion of the feet and ankles. Settle think it's a sprain. Will have patient follow-up with regular doctor and elevate her legs when she's not walking. Also patient without any shortness of breath should saturations are fine. No concerns about congestive heart failure.  Final Clinical Impressions(s) / ED Diagnoses   Final diagnoses:  Chronic pain of right knee  Foot swelling    New Prescriptions New Prescriptions   No medications on file     Vanetta Mulders, MD 02/20/17 1641

## 2017-02-20 NOTE — ED Notes (Signed)
Patient transported to Ultrasound 

## 2017-02-20 NOTE — ED Notes (Signed)
Pt ambulatory to and from RR using walker with no assistance, in NAD.

## 2017-02-20 NOTE — ED Triage Notes (Signed)
Pt reports R foot swelling x 3 days. Swelling seems to go down with elevation. Reports some pain around the knee this morning.

## 2017-02-20 NOTE — Discharge Instructions (Signed)
Recommend leg elevation when not on her feet. No danger and ambulating. Make appointment to follow-up with her record Dr. Shireen Quan workup included no signs of a deep vein thrombosis. No bony injuries.

## 2017-06-01 ENCOUNTER — Emergency Department (HOSPITAL_BASED_OUTPATIENT_CLINIC_OR_DEPARTMENT_OTHER)
Admission: EM | Admit: 2017-06-01 | Discharge: 2017-06-01 | Disposition: A | Discharge status: Home / Self Care | Payer: Medicare Other | Attending: Emergency Medicine | Admitting: Emergency Medicine

## 2017-06-01 ENCOUNTER — Emergency Department (HOSPITAL_BASED_OUTPATIENT_CLINIC_OR_DEPARTMENT_OTHER): Payer: Medicare Other

## 2017-06-01 ENCOUNTER — Encounter (HOSPITAL_BASED_OUTPATIENT_CLINIC_OR_DEPARTMENT_OTHER): Payer: Self-pay | Admitting: Student

## 2017-06-01 DIAGNOSIS — S41011A Laceration without foreign body of right shoulder, initial encounter: Secondary | ICD-10-CM | POA: Insufficient documentation

## 2017-06-01 DIAGNOSIS — I10 Essential (primary) hypertension: Secondary | ICD-10-CM | POA: Insufficient documentation

## 2017-06-01 DIAGNOSIS — Z79899 Other long term (current) drug therapy: Secondary | ICD-10-CM | POA: Diagnosis not present

## 2017-06-01 DIAGNOSIS — S71011A Laceration without foreign body, right hip, initial encounter: Secondary | ICD-10-CM | POA: Diagnosis not present

## 2017-06-01 DIAGNOSIS — W1811XA Fall from or off toilet without subsequent striking against object, initial encounter: Secondary | ICD-10-CM | POA: Diagnosis not present

## 2017-06-01 DIAGNOSIS — Y939 Activity, unspecified: Secondary | ICD-10-CM | POA: Diagnosis not present

## 2017-06-01 DIAGNOSIS — Z7982 Long term (current) use of aspirin: Secondary | ICD-10-CM | POA: Insufficient documentation

## 2017-06-01 DIAGNOSIS — S51011A Laceration without foreign body of right elbow, initial encounter: Secondary | ICD-10-CM | POA: Insufficient documentation

## 2017-06-01 DIAGNOSIS — Y92002 Bathroom of unspecified non-institutional (private) residence single-family (private) house as the place of occurrence of the external cause: Secondary | ICD-10-CM | POA: Insufficient documentation

## 2017-06-01 DIAGNOSIS — W19XXXA Unspecified fall, initial encounter: Secondary | ICD-10-CM

## 2017-06-01 DIAGNOSIS — M25551 Pain in right hip: Secondary | ICD-10-CM | POA: Diagnosis present

## 2017-06-01 DIAGNOSIS — Y999 Unspecified external cause status: Secondary | ICD-10-CM | POA: Insufficient documentation

## 2017-06-01 MED ORDER — ACETAMINOPHEN-CODEINE #3 300-30 MG PO TABS
1.0000 | ORAL_TABLET | Freq: Once | ORAL | Status: AC
  Administered 2017-06-01: 1 via ORAL
  Filled 2017-06-01: qty 1

## 2017-06-01 NOTE — ED Triage Notes (Signed)
Pt fell at home while she was walking with a walker.  No dizziness or tripping noted.  No head injury or loc.  Pt crawled back to her recliner.  Pt c/o pain to right upper thigh, right knee.

## 2017-06-01 NOTE — Discharge Instructions (Signed)
Use extra strength Tylenol as needed for pain. Rest as much as possible. Limit movement of the right leg away from the body. Have extra caution and ask for assistance when moving around to prevent further fall or injury. Follow-up with her orthopedic doctor in one week. Return to the ED if you develop numbness or tingling in the leg, worsening pain, fever, chills, or any new or worsening symptoms.

## 2017-06-01 NOTE — ED Provider Notes (Signed)
[facility] Provider Note   CSN: [id number] Arrival date & time: 06/01/17  1024     History   Chief Complaint Chief Complaint  Patient presents with  . Fall    HPI [name] is a 83 y.o. female presenting with right hip pain after fall.  Patient states she was in the bathroom this morning transferring from the commode to her walker, and she had a mechanical fall. She denies dizziness, syncope, or other abnormal feeling prior to the fall. When she fell, she denies hitting her head or loss of consciousness. She is not on blood thinners. She fell onto her right side. She was unable to walk after the fall due to pain in her hip.  Currently she denies dizziness, confusion, slurred speech, nausea, vision changes, or head neck or back pain. She reports pain when she moves her right leg, but no pain at rest. She denies pain in her shoulder or elbow. She did not take anything for pain prior to arrival. Family in the room, corroborates the story and states the patient is acting normal. Patient without other symptoms including fever, chills, chest pain, shortness of breath, abdominal pain, or urinary symptoms.  HPI  Past Medical History:  Diagnosis Date  . Arthritis   . Atrial fibrillation (HCC)   . GERD (gastroesophageal reflux disease)   . Hiatal hernia   . HLD (hyperlipidemia)   . HTN (hypertension)   . S/P dilatation of esophageal stricture   . Thyroid disorder     Patient Active Problem List   Diagnosis Date Noted  . Fall 04/03/2015  . Acute blood loss anemia 04/03/2015  . Multiple rib fractures 04/01/2015  . GERD with stricture 11/23/2011  . HYPERLIPIDEMIA 03/07/2010  . Essential hypertension 03/07/2010    Past Surgical History:  Procedure Laterality Date  . APPENDECTOMY    . CATARACT EXTRACTION    . CHOLECYSTECTOMY    . COLONOSCOPY  01/18/2007   tortuous or redundant colon  . TONSILLECTOMY    . TOTAL HIP ARTHROPLASTY    . UPPER GASTROINTESTINAL  ENDOSCOPY  03/12/2010, 11/26/2011   esophageal stricture dilations, hiatal hernia    OB History    No data available       Home Medications    Prior to Admission medications   Medication Sig Start Date End Date Taking? Authorizing Provider  amLODipine (NORVASC) 2.5 MG tablet Take 2.5 mg by mouth daily.     Yes [provider]  aspirin EC 81 MG tablet Take 81 mg by mouth daily.     Yes [provider]  cholecalciferol (VITAMIN D) 1000 units tablet Take 1,000 Units by mouth 2 (two) times daily.   Yes [provider]  co-enzyme Q-10 30 MG capsule Take 30 mg by mouth daily.     Yes [provider]  Glucosamine-Chondroitin (OSTEO BI-FLEX REGULAR STRENGTH PO) Take 1 Dose by mouth daily.   Yes [provider]  lisinopril (PRINIVIL,ZESTRIL) 10 MG tablet Take 10 mg by mouth daily.    Yes [provider]  omeprazole (PRILOSEC) 20 MG capsule Take 20 mg by mouth daily.   Yes [provider]  oxybutynin (DITROPAN) 5 MG tablet Take 5 mg by mouth 2 (two) times daily after a meal. For bladder 01/24/15  Yes [provider]  pyridOXINE (VITAMIN B-6) 100 MG tablet Take 100 mg by mouth daily.   Yes [provider]    Family History Family History  Problem Relation Age  of Onset  . Diabetes Brother   . Heart disease Father   . Colon cancer Neg Hx   . Esophageal cancer Neg Hx   . Stomach cancer Neg Hx     Social History Social History  Substance Use Topics  . Smoking status: Never Smoker  . Smokeless tobacco: Never Used  . Alcohol use No     Allergies   Amoxicillin   Review of Systems Review of Systems  All other systems reviewed and are negative.    Physical Exam Updated Vital Signs BP (!) 146/70 (BP Location: Left Arm)   Pulse 69   Temp 98.5 F (36.9 C) (Oral)   Resp 18   Ht 5\' 2"  (1.575 m)   Wt 54.4 kg (120 lb)   SpO2 100%   BMI 21.95 kg/m   Physical Exam  Constitutional: She is oriented to  person, place, and time. She appears well-developed and well-nourished. No distress.  HENT:  Head: Normocephalic and atraumatic.  Nose: Nose normal.  Mouth/Throat: Uvula is midline, oropharynx is clear and moist and mucous membranes are normal.  Eyes: Conjunctivae, EOM and lids are normal. Pupils are equal, round, and reactive to light.  Neck: Normal range of motion. Neck supple.  Cardiovascular: Normal rate, regular rhythm, normal heart sounds and intact distal pulses.   Pulmonary/Chest: Effort normal and breath sounds normal. No respiratory distress. She has no wheezes. She exhibits no tenderness.  Abdominal: Soft. Bowel sounds are normal. She exhibits no distension. There is no tenderness.  Musculoskeletal: Normal range of motion.       Right shoulder: She exhibits laceration (Skin tear, dime sized). She exhibits no tenderness, no swelling and no pain.       Right elbow: She exhibits laceration (skin tear, dime sized). She exhibits normal range of motion and no swelling. No tenderness found.       Right hip: She exhibits tenderness. She exhibits no swelling, no crepitus and no laceration.  Patient without pain to neck or back. No tenderness to palpation of cervical thoracic or lumbar spine. Patient with full range of motion of neck without pain.  Upper ext: Patient with full range of motion of the arm and the elbow. Strength intact bilaterally. Pulses intact bilaterally. Color and warmth equal bilaterally. Lower ext: Patient with tenderness palpation of the lateral right hip, but no obvious signs of swelling, contusion, laceration. No tenderness palpation of the left hip. Patient able to do a straight leg raise with pain. No pain with left-sided chest leg raise. Patient with full movement of ankles and toes without pain. Sensation intact bilaterally. Pulses intact bilaterally. Color and warmth equal bilaterally.  Neurological: She is alert and oriented to person, place, and time. She has normal  strength. No cranial nerve deficit or sensory deficit. She displays a negative Romberg sign. GCS eye subscore is 4. GCS verbal subscore is 5. GCS motor subscore is 6.  Skin: Skin is warm and dry. Laceration noted.  2 skin tears time sized, one on the right shoulder and one on the right elbow.  Nursing note and vitals reviewed.    ED Treatments / Results  Labs (all labs ordered are listed, but only abnormal results are displayed) Labs Reviewed - No data to display  EKG  EKG Interpretation None       Radiology Dg Hip Unilat With Pelvis 2-3 Views Right  Result Date: 06/01/2017 CLINICAL DATA:  Status post fall onto the right hip in the bathroom this morning. History  of right hip replacement. EXAM: DG HIP (WITH OR WITHOUT PELVIS) 2-3V RIGHT COMPARISON:  [facility] of January 18, 2010 FINDINGS: The bony pelvis is subjectively osteopenic. There is no lytic or blastic lesion. No acute pelvic fracture is observed. AP and lateral views of the prosthetic right hip reveal normal positioning of the prosthetic components. There is no evidence of loosening. There is abnormal lucency through the base of the greater trochanter. An avulsion fracture is also noted along the lateral aspect of the intertrochanteric region. These findings appear new when compared to the postoperative study of September 08, 2006. IMPRESSION: Probable acute fracture through the base of the greater trochanter of the right femur which involves the superior aspect of the stem of the prosthesis. No acute pelvic fracture is observed. Electronically Signed   By: David  [place] M.D.   On: 06/01/2017 11:25    Procedures Procedures (including critical care time)  Medications Ordered in ED Medications  acetaminophen-codeine (TYLENOL #3) 300-30 MG per tablet 1 tablet (1 tablet Oral Given 06/01/17 1232)     Initial Impression / Assessment and Plan / ED Course  I have reviewed the triage vital signs and the nursing notes.  Pertinent labs &  imaging results that were available during my care of the patient were reviewed by me and considered in my medical decision making (see chart for details).  Clinical Course as of Jun 01 1318  Tue Jun 01, 2017  1103 Due to patient's age and hip pain, I will order hip x-ray to rule out fracture. Patient without shoulder or elbow pain at this time is slight skin tears, so I will not order x-ray of the upper extremity. Patient states she is not in pain when she is sitting still, does not want anything for pain at this time. Will provide ice pack for pain as needed.  No loss of consciousness, patient did not hit her head, and cranial nerves all intact. Low suspicion for head injury at this time. Patient without neck pain or back pain at this time.  [[place]]  1135 X-ray shows probable acute fracture through the base of the greater trochanter of the right femur. Will contact ortho about disposition.   [[place]]  1200 Discussed case with ortho, and stated patient can be discharged home with follow-up visit in one week. Patient to avoid Abduction of the leg. Weight-bear as tolerated with walker. Discussed case with attending and Dr. Particia Nearing saw pt.   [[place]]  1258 Patient to use her at home extra strength tylenol as needed for pain. Will give one dose here for pain relief during transport home. Patient instructed to rest, weight-bear as tolerated with walker, and avoid abduction of the leg. Discussed with family, and stated patient will need increased help at home. Patient to follow-up in one week with ortho. Return precautions given. Patient and family state understanding and agreement with plan.  [[place]]    Clinical Course User Index [] Alveria Apley, PA-C     Final Clinical Impressions(s) / ED Diagnoses   Final diagnoses:  Fall, initial encounter  Right hip pain    New Prescriptions Discharge Medication List as of 06/01/2017 12:25 PM       Alveria Apley, PA-C 06/01/17 1319    Jacalyn Lefevre,  MD 06/01/17 1459

## 2017-06-01 NOTE — ED Notes (Signed)
Patient transported to X-ray 

## 2017-11-21 ENCOUNTER — Other Ambulatory Visit: Payer: Self-pay

## 2017-11-21 ENCOUNTER — Encounter (HOSPITAL_COMMUNITY): Payer: Self-pay | Admitting: Emergency Medicine

## 2017-11-21 ENCOUNTER — Inpatient Hospital Stay (HOSPITAL_COMMUNITY)
Admission: EM | Admit: 2017-11-21 | Discharge: 2017-11-23 | DRG: 280 | Disposition: A | Discharge status: Home / Self Care | Payer: Medicare Other | Attending: Internal Medicine | Admitting: Internal Medicine

## 2017-11-21 ENCOUNTER — Emergency Department (HOSPITAL_COMMUNITY): Payer: Medicare Other

## 2017-11-21 DIAGNOSIS — I110 Hypertensive heart disease with heart failure: Principal | ICD-10-CM | POA: Diagnosis present

## 2017-11-21 DIAGNOSIS — I480 Paroxysmal atrial fibrillation: Secondary | ICD-10-CM

## 2017-11-21 DIAGNOSIS — R079 Chest pain, unspecified: Secondary | ICD-10-CM | POA: Diagnosis present

## 2017-11-21 DIAGNOSIS — Z79899 Other long term (current) drug therapy: Secondary | ICD-10-CM

## 2017-11-21 DIAGNOSIS — Z791 Long term (current) use of non-steroidal anti-inflammatories (NSAID): Secondary | ICD-10-CM

## 2017-11-21 DIAGNOSIS — E785 Hyperlipidemia, unspecified: Secondary | ICD-10-CM | POA: Diagnosis present

## 2017-11-21 DIAGNOSIS — M1990 Unspecified osteoarthritis, unspecified site: Secondary | ICD-10-CM | POA: Diagnosis present

## 2017-11-21 DIAGNOSIS — K219 Gastro-esophageal reflux disease without esophagitis: Secondary | ICD-10-CM | POA: Diagnosis present

## 2017-11-21 DIAGNOSIS — R778 Other specified abnormalities of plasma proteins: Secondary | ICD-10-CM

## 2017-11-21 DIAGNOSIS — I5031 Acute diastolic (congestive) heart failure: Secondary | ICD-10-CM | POA: Diagnosis present

## 2017-11-21 DIAGNOSIS — R7989 Other specified abnormal findings of blood chemistry: Secondary | ICD-10-CM

## 2017-11-21 DIAGNOSIS — Z96649 Presence of unspecified artificial hip joint: Secondary | ICD-10-CM | POA: Diagnosis present

## 2017-11-21 DIAGNOSIS — I2510 Atherosclerotic heart disease of native coronary artery without angina pectoris: Secondary | ICD-10-CM | POA: Diagnosis present

## 2017-11-21 DIAGNOSIS — Z7982 Long term (current) use of aspirin: Secondary | ICD-10-CM

## 2017-11-21 DIAGNOSIS — I219 Acute myocardial infarction, unspecified: Secondary | ICD-10-CM

## 2017-11-21 DIAGNOSIS — I447 Left bundle-branch block, unspecified: Secondary | ICD-10-CM | POA: Diagnosis present

## 2017-11-21 DIAGNOSIS — J9811 Atelectasis: Secondary | ICD-10-CM | POA: Diagnosis present

## 2017-11-21 DIAGNOSIS — I214 Non-ST elevation (NSTEMI) myocardial infarction: Secondary | ICD-10-CM | POA: Diagnosis present

## 2017-11-21 DIAGNOSIS — Z880 Allergy status to penicillin: Secondary | ICD-10-CM

## 2017-11-21 DIAGNOSIS — R402414 Glasgow coma scale score 13-15, 24 hours or more after hospital admission: Secondary | ICD-10-CM | POA: Diagnosis not present

## 2017-11-21 HISTORY — DX: Acute myocardial infarction, unspecified: I21.9

## 2017-11-21 LAB — COMPREHENSIVE METABOLIC PANEL
ALBUMIN: 3.3 g/dL — AB (ref 3.5–5.0)
ALT: 10 U/L — ABNORMAL LOW (ref 14–54)
AST: 26 U/L (ref 15–41)
Alkaline Phosphatase: 95 U/L (ref 38–126)
Anion gap: 8 (ref 5–15)
BUN: 7 mg/dL (ref 6–20)
CALCIUM: 9.2 mg/dL (ref 8.9–10.3)
CO2: 25 mmol/L (ref 22–32)
Chloride: 101 mmol/L (ref 101–111)
Creatinine, Ser: 1.08 mg/dL — ABNORMAL HIGH (ref 0.44–1.00)
GFR calc non Af Amer: 42 mL/min — ABNORMAL LOW (ref >60)
GFR, EST AFRICAN AMERICAN: 49 mL/min — AB (ref >60)
GLUCOSE: 169 mg/dL — AB (ref 65–99)
POTASSIUM: 3.8 mmol/L (ref 3.5–5.1)
SODIUM: 134 mmol/L — AB (ref 135–145)
Total Bilirubin: 0.5 mg/dL (ref 0.3–1.2)
Total Protein: 6.1 g/dL — ABNORMAL LOW (ref 6.5–8.1)

## 2017-11-21 LAB — CBC
HCT: 41.7 % (ref 36.0–46.0)
HEMOGLOBIN: 13.7 g/dL (ref 12.0–15.0)
MCH: 28.2 pg (ref 26.0–34.0)
MCHC: 32.9 g/dL (ref 30.0–36.0)
MCV: 85.8 fL (ref 78.0–100.0)
PLATELETS: 295 10*3/uL (ref 150–400)
RBC: 4.86 MIL/uL (ref 3.87–5.11)
RDW: 14.8 % (ref 11.5–15.5)
WBC: 8.7 10*3/uL (ref 4.0–10.5)

## 2017-11-21 LAB — BRAIN NATRIURETIC PEPTIDE: B Natriuretic Peptide: 301.5 pg/mL — ABNORMAL HIGH (ref 0.0–100.0)

## 2017-11-21 LAB — I-STAT TROPONIN, ED: TROPONIN I, POC: 0.25 ng/mL — AB (ref 0.00–0.08)

## 2017-11-21 LAB — TROPONIN I
Troponin I: 13.71 ng/mL (ref <0.03)
Troponin I: 24.88 ng/mL (ref <0.03)

## 2017-11-21 MED ORDER — SENNA 8.6 MG PO TABS: 1.0000 | ORAL_TABLET | Freq: Every day | ORAL | Status: DC | PRN

## 2017-11-21 MED ORDER — OXYBUTYNIN CHLORIDE 5 MG PO TABS
5.0000 mg | ORAL_TABLET | Freq: Three times a day (TID) | ORAL | Status: DC
  Administered 2017-11-21 – 2017-11-23 (×5): 5 mg via ORAL
  Filled 2017-11-21 (×5): qty 1

## 2017-11-21 MED ORDER — COENZYME Q10 30 MG PO CAPS: 30.0000 mg | ORAL_CAPSULE | Freq: Every day | ORAL | Status: DC

## 2017-11-21 MED ORDER — HEPARIN (PORCINE) IN NACL 100-0.45 UNIT/ML-% IJ SOLN
700.0000 [IU]/h | INTRAMUSCULAR | Status: DC
  Administered 2017-11-21 – 2017-11-22 (×2): 700 [IU]/h via INTRAVENOUS
  Filled 2017-11-21 (×2): qty 250

## 2017-11-21 MED ORDER — ATORVASTATIN CALCIUM 80 MG PO TABS
80.0000 mg | ORAL_TABLET | Freq: Every day | ORAL | Status: DC
  Administered 2017-11-22: 80 mg via ORAL
  Filled 2017-11-21: qty 1

## 2017-11-21 MED ORDER — ENOXAPARIN SODIUM 30 MG/0.3ML ~~LOC~~ SOLN: 30.0000 mg | SUBCUTANEOUS | Status: DC

## 2017-11-21 MED ORDER — HEPARIN BOLUS VIA INFUSION
3500.0000 [IU] | Freq: Once | INTRAVENOUS | Status: AC
  Administered 2017-11-21: 3500 [IU] via INTRAVENOUS
  Filled 2017-11-21: qty 3500

## 2017-11-21 MED ORDER — VITAMIN D 1000 UNITS PO TABS
2000.0000 [IU] | ORAL_TABLET | Freq: Two times a day (BID) | ORAL | Status: DC
  Administered 2017-11-21 – 2017-11-23 (×4): 2000 [IU] via ORAL
  Filled 2017-11-21 (×4): qty 2

## 2017-11-21 MED ORDER — TICAGRELOR 90 MG PO TABS
180.0000 mg | ORAL_TABLET | Freq: Once | ORAL | Status: AC
  Administered 2017-11-21: 180 mg via ORAL
  Filled 2017-11-21: qty 2

## 2017-11-21 MED ORDER — ACETAMINOPHEN 325 MG PO TABS: 650.0000 mg | ORAL_TABLET | Freq: Four times a day (QID) | ORAL | Status: DC | PRN

## 2017-11-21 MED ORDER — ACETAMINOPHEN 650 MG RE SUPP: 650.0000 mg | Freq: Four times a day (QID) | RECTAL | Status: DC | PRN

## 2017-11-21 MED ORDER — PANTOPRAZOLE SODIUM 40 MG PO TBEC
40.0000 mg | DELAYED_RELEASE_TABLET | Freq: Every day | ORAL | Status: DC
  Administered 2017-11-22 – 2017-11-23 (×2): 40 mg via ORAL
  Filled 2017-11-21 (×2): qty 1

## 2017-11-21 MED ORDER — LISINOPRIL 10 MG PO TABS
10.0000 mg | ORAL_TABLET | Freq: Every day | ORAL | Status: DC
  Administered 2017-11-22 – 2017-11-23 (×2): 10 mg via ORAL
  Filled 2017-11-21 (×2): qty 1

## 2017-11-21 MED ORDER — NITROGLYCERIN IN D5W 200-5 MCG/ML-% IV SOLN
0.0000 ug/min | INTRAVENOUS | Status: DC
  Administered 2017-11-21: 5 ug/min via INTRAVENOUS
  Filled 2017-11-21: qty 250

## 2017-11-21 MED ORDER — FUROSEMIDE 10 MG/ML IJ SOLN
20.0000 mg | Freq: Once | INTRAMUSCULAR | Status: AC
  Administered 2017-11-21: 20 mg via INTRAVENOUS
  Filled 2017-11-21: qty 2

## 2017-11-21 NOTE — ED Notes (Signed)
Rec'd call from lab regarding elevated troponin 13.71; MD notified (Dr.Svalina) at 1715; and 3E RN Lauren notified AT 1717.

## 2017-11-21 NOTE — ED Notes (Signed)
Patient states chest pressure has improved from when she arrived via EMS

## 2017-11-21 NOTE — Progress Notes (Signed)
ANTICOAGULATION CONSULT NOTE  Pharmacy Consult for heparin  Indication: chest pain/ACS  Allergies  Allergen Reactions  . Amoxicillin Itching    Patient Measurements: Height: 5\' 2"  (157.5 cm) Weight: 127 lb (57.6 kg)(scale C) IBW/kg (Calculated) : 50.1   Vital Signs: Temp: 98.2 F (36.8 C) (12/16 1719) Temp Source: Oral (12/16 1719) BP: 184/67 (12/16 1719) Pulse Rate: 55 (12/16 1719)  Labs: Recent Labs    11/21/17 1122 11/21/17 1137 11/21/17 1400  HGB 13.7  --   --   HCT 41.7  --   --   PLT 295  --   --   CREATININE  --  1.08*  --   TROPONINI  --   --  13.71*    Estimated Creatinine Clearance: 24.6 mL/min (A) (by C-G formula based on SCr of 1.08 mg/dL (H)).   Medical History: Past Medical History:  Diagnosis Date  . Arthritis   . Atrial fibrillation (HCC)   . GERD (gastroesophageal reflux disease)   . Hiatal hernia   . HLD (hyperlipidemia)   . HTN (hypertension)   . S/P dilatation of esophageal stricture   . Thyroid disorder     Medications:  Medications Prior to Admission  Medication Sig Dispense Refill Last Dose  . aspirin EC 325 MG tablet Take 325 mg by mouth daily.   11/21/2017 at Unknown time  . Cholecalciferol (VITAMIN D) 2000 units CAPS Take 2,000 Units by mouth 2 (two) times daily.    11/21/2017 at Unknown time  . co-enzyme Q-10 30 MG capsule Take 30 mg by mouth daily.     11/21/2017 at Unknown time  . diclofenac sodium (VOLTAREN) 1 % GEL Place 1 application onto the skin as needed for other. For Osteoarthritis in the knees   PRN  . guaiFENesin (MUCINEX) 600 MG 12 hr tablet Take 600 mg by mouth 2 (two) times daily.   11/21/2017 at Unknown time  . lisinopril (PRINIVIL,ZESTRIL) 10 MG tablet Take 10 mg by mouth daily.    11/21/2017 at Unknown time  . naproxen sodium (ALEVE) 220 MG tablet Take 220 mg by mouth at bedtime.   11/20/2017 at Unknown time  . nitroGLYCERIN (NITROSTAT) 0.4 MG SL tablet Place 0.4 mg under the tongue every 5 (five) minutes as  needed for chest pain.   11/21/2017 at Unknown time  . oxybutynin (DITROPAN) 5 MG tablet Take 5 mg by mouth 3 (three) times daily. For bladder  0 11/21/2017 at Unknown time  . RaNITidine HCl (RANITIDINE 150 MAX STRENGTH PO) Take 150 mg by mouth at bedtime.   11/20/2017 at Unknown time  . SENNA PO Take 1 tablet by mouth daily as needed (constipation).   Past Month at Unknown time   Scheduled:  . cholecalciferol  2,000 Units Oral BID  . [START ON 11/22/2017] lisinopril  10 mg Oral Daily  . oxybutynin  5 mg Oral TID  . pantoprazole  40 mg Oral Daily    Assessment: 83 yo female with troponin ~ 13. Pharmacy consulted to dose heparin. No anticoagulants noted PTA.  -Hg= 13.7, plt= 295  Goal of Therapy:  Heparin level 0.3-0.7 units/ml Monitor platelets by anticoagulation protocol: Yes   Plan:  -Heparin bolus 3500 units IV followed by 700 units/hr -Heparin level in 8 hours and daily wth CBC daily  Harland German, Pharm D 11/21/2017 5:59 PM

## 2017-11-21 NOTE — ED Triage Notes (Signed)
Patient from [facility] for chest pressure that started at 0930 this morning. Facility gave patient 3x SL NTG with no relief. Patient rates intensity of pressure at 5/10, also complains of "a little" shortness of breath. Patient with history of paroxysmal afib, heart rate irregular with rate in 120's during triage. 20g saline lock in left forearm, received 200 mL NS PTA and 324mg  aspirin. Patient alert and oriented at this time and in no apparent distress.

## 2017-11-21 NOTE — ED Provider Notes (Signed)
[facility] EMERGENCY DEPARTMENT Provider Note   CSN: [id number] Arrival date & time: 11/21/17  1103     History   Chief Complaint No chief complaint on file.   HPI [name] is a 83 y.o. female.  HPI  Patient presents with family members assist with the HPI. Patient herself states that she feels better, but earlier today had a chest pain, sternal, nonradiating. There is associated dyspnea as well. She states that she has been feeling generally okay until the last few days or so when she has had some mild weakness, but no chest pain until today. Patient's son notes that she is previously been evaluated for atrial fibrillation, though without firm diagnosis. Patient takes aspirin for prophylaxis. She has no reported history of congestive heart failure. Patient received nitroglycerin with improvement in her pain, and on my initial exam states that she is minimally uncomfortable. Family notes the patient is a minimizer of her symptoms.   Past Medical History:  Diagnosis Date  . Arthritis   . Atrial fibrillation (HCC)   . GERD (gastroesophageal reflux disease)   . Hiatal hernia   . HLD (hyperlipidemia)   . HTN (hypertension)   . S/P dilatation of esophageal stricture   . Thyroid disorder     Patient Active Problem List   Diagnosis Date Noted  . Fall 04/03/2015  . Acute blood loss anemia 04/03/2015  . Multiple rib fractures 04/01/2015  . GERD with stricture 11/23/2011  . HYPERLIPIDEMIA 03/07/2010  . Essential hypertension 03/07/2010    Past Surgical History:  Procedure Laterality Date  . APPENDECTOMY    . CATARACT EXTRACTION    . CHOLECYSTECTOMY    . COLONOSCOPY  01/18/2007   tortuous or redundant colon  . TONSILLECTOMY    . TOTAL HIP ARTHROPLASTY    . UPPER GASTROINTESTINAL ENDOSCOPY  03/12/2010, 11/26/2011   esophageal stricture dilations, hiatal hernia    OB History    No data available       Home Medications    Prior to  Admission medications   Medication Sig Start Date End Date Taking? Authorizing Provider  amLODipine (NORVASC) 2.5 MG tablet Take 2.5 mg by mouth daily.      [provider]  aspirin EC 81 MG tablet Take 81 mg by mouth daily.      [provider]  cholecalciferol (VITAMIN D) 1000 units tablet Take 1,000 Units by mouth 2 (two) times daily.    [provider]  co-enzyme Q-10 30 MG capsule Take 30 mg by mouth daily.      [provider]  Glucosamine-Chondroitin (OSTEO BI-FLEX REGULAR STRENGTH PO) Take 1 Dose by mouth daily.    [provider]  lisinopril (PRINIVIL,ZESTRIL) 10 MG tablet Take 10 mg by mouth daily.     [provider]  omeprazole (PRILOSEC) 20 MG capsule Take 20 mg by mouth daily.    [provider]  oxybutynin (DITROPAN) 5 MG tablet Take 5 mg by mouth 2 (two) times daily after a meal. For bladder 01/24/15   [provider]  pyridOXINE (VITAMIN B-6) 100 MG tablet Take 100 mg by mouth daily.    [provider]    Family History Family History  Problem Relation Age of Onset  . Diabetes Brother   . Heart disease Father   . Colon cancer Neg Hx   . Esophageal cancer Neg Hx   . Stomach cancer Neg Hx     Social History Social History  Tobacco Use  . Smoking status: Never Smoker  . Smokeless tobacco: Never Used  Substance Use Topics  . Alcohol use: No  . Drug use: No     Allergies   Amoxicillin   Review of Systems Review of Systems  Constitutional:       Weight gain of approximately 14 pounds over the past few weeks  HENT:       Per HPI, otherwise negative  Respiratory:       Per HPI, otherwise negative  Cardiovascular:       Per HPI, otherwise negative  Gastrointestinal: Negative for vomiting.  Endocrine:       Negative aside from HPI  Genitourinary:       Neg aside from HPI   Musculoskeletal:       Per HPI, otherwise negative  Skin: Negative.   Neurological: Positive for  weakness. Negative for syncope.     Physical Exam Updated Vital Signs BP (!) 167/99   Pulse 73   Resp (!) 21   SpO2 100%   Physical Exam  Constitutional: She is oriented to person, place, and time. She appears well-developed and well-nourished. No distress.  HENT:  Head: Normocephalic and atraumatic.  Eyes: Conjunctivae and EOM are normal.  Cardiovascular: An irregularly irregular rhythm present. Tachycardia present.  Pulmonary/Chest: Effort normal and breath sounds normal. No stridor. No respiratory distress.  Abdominal: She exhibits no distension.  Musculoskeletal: She exhibits no edema.  Neurological: She is alert and oriented to person, place, and time. No cranial nerve deficit.  Skin: Skin is warm and dry.  Psychiatric: She has a normal mood and affect.  Nursing note and vitals reviewed.    ED Treatments / Results  Labs (all labs ordered are listed, but only abnormal results are displayed) Labs Reviewed  COMPREHENSIVE METABOLIC PANEL - Abnormal; Notable for the following components:      Result Value   Sodium 134 (*)    Glucose, Bld 169 (*)    Creatinine, Ser 1.08 (*)    Total Protein 6.1 (*)    Albumin 3.3 (*)    ALT 10 (*)    GFR calc non Af Amer 42 (*)    GFR calc Af Amer 49 (*)    All other components within normal limits  BRAIN NATRIURETIC PEPTIDE - Abnormal; Notable for the following components:   B Natriuretic Peptide 301.5 (*)    All other components within normal limits  I-STAT TROPONIN, ED - Abnormal; Notable for the following components:   Troponin i, poc 0.25 (*)    All other components within normal limits  CBC    EKG  EKG Interpretation  Date/Time:  Sunday November 21 2017 11:12:42 EST Ventricular Rate:  121 PR Interval:    QRS Duration: 150 QT Interval:  375 QTC Calculation: 533 R Axis:   -37 Text Interpretation:  Atrial fibrillation Left bundle branch block Abnormal ekg Confirmed by Gerhard MunchLockwood, Anyelin Mogle [phone]([id number]) on 11/21/2017 11:15:41 AM         Radiology Dg Chest 2 View  Result Date: 11/21/2017 CLINICAL DATA:  Chest pressure and lightheadedness. EXAM: CHEST  2 VIEW COMPARISON:  04/02/2015 FINDINGS: The cardiac silhouette is enlarged. Calcific atherosclerotic disease and tortuosity of the aorta. There is no evidence of focal airspace consolidation, pleural effusion or pneumothorax. Low lung volume with bilateral lower lobe atelectasis. Mild chronic prominence of the interstitium. Healed right-sided rib fractures.  Soft tissues are grossly normal. IMPRESSION: Enlarged cardiac silhouette. Mild chronic prominence of the interstitium,  with low lung volumes and mild bibasilar atelectasis. Electronically Signed   By: Ted Mcalpine M.D.   On: 11/21/2017 12:17    Procedures Procedures (including critical care time)  Medications Ordered in ED Medications  furosemide (LASIX) injection 20 mg (20 mg Intravenous Given 11/21/17 1402)     Initial Impression / Assessment and Plan / ED Course  I have reviewed the triage vital signs and the nursing notes.  Pertinent labs & imaging results that were available during my care of the patient were reviewed by me and considered in my medical decision making (see chart for details).  Patient's initial labs notable for elevated troponin. On repeat exam she appears in similar condition, comfortable, no active complaints.  Remaining labs notable for elevated BNP, and there is some suspicion for heart failure contributing to her tachycardia/A. fib. On chart review it is clear that the patient has previously been diagnosed with A. fib, though they are not aware of this. I discussed patient's case with our cardiology colleagues Given the patient's advanced age, multiple comorbidities, we agreed that addressing the patient's fluid overload status with Lasix is reasonable, with hospitalist and/or internal medicine admission for continued monitoring, management. On repeat exam patient is in similar  condition. Patient and family aware of need for admission for management of elevated BNP, A. fib with RVR, elevated troponin.   Final Clinical Impressions(s) / ED Diagnoses  Atrial fibrillation Elevated troponin Heart failure exacerbation   Gerhard Munch, MD 11/21/17 1544

## 2017-11-21 NOTE — ED Notes (Signed)
Patient transported to x-ray. ?

## 2017-11-21 NOTE — H&P (Signed)
Date: 11/21/2017               Patient Name:  [name] MRN: [id number]  DOB: 11/14/1920 Age / Sex: 83 y.o., female   PCP: Patient, No Pcp Per         Medical Service: Internal Medicine Teaching Service         Attending Physician: Dr. Sandre Kitty Elwin Mocha, MD    First Contact: Dr. Thornell Mule Pager: [phone]  Second Contact: Dr. Nyra Market Pager: [phone]       After Hours (After 5p/  First Contact Pager: [phone]  weekends / holidays): Second Contact Pager: [phone]   Chief Complaint: Chest tightness, SOB  History of Present Illness: Patient is a 83 year old female who comes with past medical history of paroxysmal A. fib, GERD, hyperlipidemia, hypertension, OA.  She presents today from her assisted living facility due to chest tightness and pressure that began this morning when she was getting her breakfast.  She had associated symptoms of shortness of breath, felt dizzy like she was going to pass out, felt like her heart was racing. She denies diaphoresis but her son reports that the caretaker there noticed she was cool and clammy.  She was not exerting herself during the chest pain, the pain was not pleuritic or reproducible. she denies any recent fevers, sick contacts, she does mentions she has had some recent abdominal pain and had one episode of vomiting 2 nights ago.  She does mention recent GERD symptoms including sore throat and burning occurring in the middle of the chest and of her throat.  She was given sublingual nitroglycerin x3 at her facility return.  The patient mentions that the pain only lasted from this morning until she arrived here at the hospital.    ED course: She arrived to the emergency department in atrial fibrillation with RVR, she had an elevated BNP of 301.5, her initial i-STAT troponin was 0.25.  Her Chest X-Ray Showed an Enlarged heart, and mild bibasilar atelectasis.    Meds:  Current Meds  Medication Sig  . aspirin EC 325 MG tablet  Take 325 mg by mouth daily.  . Cholecalciferol (VITAMIN D) 2000 units CAPS Take 2,000 Units by mouth 2 (two) times daily.   Marland Kitchen co-enzyme Q-10 30 MG capsule Take 30 mg by mouth daily.    . diclofenac sodium (VOLTAREN) 1 % GEL Place 1 application onto the skin as needed for other. For Osteoarthritis in the knees  . guaiFENesin (MUCINEX) 600 MG 12 hr tablet Take 600 mg by mouth 2 (two) times daily.  Marland Kitchen lisinopril (PRINIVIL,ZESTRIL) 10 MG tablet Take 10 mg by mouth daily.   . naproxen sodium (ALEVE) 220 MG tablet Take 220 mg by mouth at bedtime.  . nitroGLYCERIN (NITROSTAT) 0.4 MG SL tablet Place 0.4 mg under the tongue every 5 (five) minutes as needed for chest pain.  Marland Kitchen oxybutynin (DITROPAN) 5 MG tablet Take 5 mg by mouth 3 (three) times daily. For bladder  . RaNITidine HCl (RANITIDINE 150 MAX STRENGTH PO) Take 150 mg by mouth at bedtime.  . SENNA PO Take 1 tablet by mouth daily as needed (constipation).     Allergies: Allergies as of 11/21/2017 - Review Complete 11/21/2017  Allergen Reaction Noted  . Amoxicillin Itching 04/01/2015   Past Medical History:  Diagnosis Date  . Arthritis   . Atrial fibrillation (HCC)   . GERD (gastroesophageal reflux disease)   . Hiatal hernia   . HLD (  hyperlipidemia)   . HTN (hypertension)   . S/P dilatation of esophageal stricture   . Thyroid disorder     Family History:  Family History  Problem Relation Age of Onset  . Diabetes Brother   . Heart disease Father   . Colon cancer Neg Hx   . Esophageal cancer Neg Hx   . Stomach cancer Neg Hx     Social History:  Social History   Socioeconomic History  . Marital status: Widowed    Spouse name: Not on file  . Number of children: 1  . Years of education: Not on file  . Highest education level: Not on file  Social Needs  . Financial resource strain: Not on file  . Food insecurity - worry: Not on file  . Food insecurity - inability: Not on file  . Transportation needs - medical: Not on file  .  Transportation needs - non-medical: Not on file  Occupational History  . Occupation: Retired  Tobacco Use  . Smoking status: Never Smoker  . Smokeless tobacco: Never Used  Substance and Sexual Activity  . Alcohol use: No  . Drug use: No  . Sexual activity: Not on file  Other Topics Concern  . Not on file  Social History Narrative  . Not on file    Review of Systems: A complete ROS was negative except as per HPI.   Physical Exam: Blood pressure (!) 156/76, pulse (!) 57, resp. rate 18, SpO2 97 %. Physical Exam  Constitutional: She is oriented to person, place, and time. No distress.  Eyes: Right eye exhibits no discharge. Left eye exhibits no discharge. No scleral icterus.  Neck: JVD present.  Cardiovascular: Regular rhythm and normal heart sounds. Bradycardia present. Exam reveals no gallop and no friction rub.  No murmur heard. Pulmonary/Chest: Effort normal. No respiratory distress. She has no wheezes. She has no rhonchi. She has rales in the right lower field. She exhibits no tenderness.  Abdominal: Soft. Bowel sounds are normal. She exhibits no distension and no mass. There is no tenderness. There is no rebound and no guarding.  Neurological: She is alert and oriented to person, place, and time.  Skin: She is not diaphoretic.    EKG: personally reviewed my interpretation is Afib w/rvr, LBBB, non specific ST  CXR: personally reviewed my interpretation is bibasilar atelectasis  Assessment & Plan by Problem: Principal Problem:   Chest pain  Patient Profile: Patient is a 83 year old female who comes with past medical history of paroxysmal A. fib, GERD, hyperlipidemia, hypertension, OA who presents with chest tightness that began this morning during breakfast.    Chest Pain: pt is 83 yo, likely has ischemic CAD, elevated troponin, was found to be in Afib w/RVR ddx includes demand ischemia from afib, possible ischemia or evolving infarct,   -trend troponins, serial  EKG's -ECHO -Cardiology consulted, recommended above UPDATE: 1800 Pt with second troponin of 13.71, feeling fatigued Consulted Cardiology feel she would not do well with cath, recommend medical management for now heparin, ticagrelor+asa, nitro drip, atorvastatin 80mg   PAF: chronic, diagnosed 8 years ago, pts cardiologist and family discussed and agreed no anticoagulation  -PRN metoprolol if needed  GERD: pt on ranitidine, will switch to pantoprazole for better symptomatic control   Dispo: Admit patient to Observation with expected length of stay less than 2 midnights.  Signed: Angelita InglesWinfrey, Mahrukh Seguin B, MD 11/21/2017, 4:11 PM  Thornell MuleBrandon Donyetta Ogletree MD PGY-1 Internal Medicine Pager # [phone]

## 2017-11-21 NOTE — Progress Notes (Signed)
PHARMACIST - PHYSICIAN ORDER COMMUNICATION  CONCERNING: P&T Medication Policy on Herbal Medications  DESCRIPTION:  This patient's order for: Co-enzyme Q-10  has been noted.  This product(s) is classified as an "herbal" or natural product. Due to a lack of definitive safety studies or FDA approval, nonstandard manufacturing practices, plus the potential risk of unknown drug-drug interactions while on inpatient medications, the Pharmacy and Therapeutics Committee does not permit the use of "herbal" or natural products of this type within [facility]   ACTION TAKEN: The pharmacy department is unable to verify this order at this time. Please reevaluate patient's clinical condition at discharge and address if the herbal or natural product(s) should be resumed at that time.   Dennie Fetters, [place] Pager: [phone] 11/21/2017 5:18 PM

## 2017-11-22 ENCOUNTER — Observation Stay (HOSPITAL_COMMUNITY): Payer: Medicare Other

## 2017-11-22 DIAGNOSIS — I447 Left bundle-branch block, unspecified: Secondary | ICD-10-CM | POA: Diagnosis present

## 2017-11-22 DIAGNOSIS — Z791 Long term (current) use of non-steroidal anti-inflammatories (NSAID): Secondary | ICD-10-CM | POA: Diagnosis not present

## 2017-11-22 DIAGNOSIS — I351 Nonrheumatic aortic (valve) insufficiency: Secondary | ICD-10-CM

## 2017-11-22 DIAGNOSIS — R079 Chest pain, unspecified: Secondary | ICD-10-CM | POA: Diagnosis present

## 2017-11-22 DIAGNOSIS — Z880 Allergy status to penicillin: Secondary | ICD-10-CM | POA: Diagnosis not present

## 2017-11-22 DIAGNOSIS — I214 Non-ST elevation (NSTEMI) myocardial infarction: Secondary | ICD-10-CM | POA: Diagnosis present

## 2017-11-22 DIAGNOSIS — K219 Gastro-esophageal reflux disease without esophagitis: Secondary | ICD-10-CM | POA: Diagnosis present

## 2017-11-22 DIAGNOSIS — I480 Paroxysmal atrial fibrillation: Secondary | ICD-10-CM | POA: Diagnosis present

## 2017-11-22 DIAGNOSIS — J9811 Atelectasis: Secondary | ICD-10-CM | POA: Diagnosis present

## 2017-11-22 DIAGNOSIS — Z7982 Long term (current) use of aspirin: Secondary | ICD-10-CM | POA: Diagnosis not present

## 2017-11-22 DIAGNOSIS — Z79899 Other long term (current) drug therapy: Secondary | ICD-10-CM | POA: Diagnosis not present

## 2017-11-22 DIAGNOSIS — I2510 Atherosclerotic heart disease of native coronary artery without angina pectoris: Secondary | ICD-10-CM | POA: Diagnosis present

## 2017-11-22 DIAGNOSIS — Z96649 Presence of unspecified artificial hip joint: Secondary | ICD-10-CM | POA: Diagnosis present

## 2017-11-22 DIAGNOSIS — M1990 Unspecified osteoarthritis, unspecified site: Secondary | ICD-10-CM | POA: Diagnosis present

## 2017-11-22 DIAGNOSIS — I110 Hypertensive heart disease with heart failure: Secondary | ICD-10-CM | POA: Diagnosis present

## 2017-11-22 DIAGNOSIS — I5031 Acute diastolic (congestive) heart failure: Secondary | ICD-10-CM | POA: Diagnosis present

## 2017-11-22 DIAGNOSIS — R402414 Glasgow coma scale score 13-15, 24 hours or more after hospital admission: Secondary | ICD-10-CM | POA: Diagnosis not present

## 2017-11-22 DIAGNOSIS — E785 Hyperlipidemia, unspecified: Secondary | ICD-10-CM | POA: Diagnosis present

## 2017-11-22 LAB — CBC
HCT: 39.6 % (ref 36.0–46.0)
Hemoglobin: 13.4 g/dL (ref 12.0–15.0)
MCH: 28.1 pg (ref 26.0–34.0)
MCHC: 33.8 g/dL (ref 30.0–36.0)
MCV: 83 fL (ref 78.0–100.0)
Platelets: 281 10*3/uL (ref 150–400)
RBC: 4.77 MIL/uL (ref 3.87–5.11)
RDW: 14.3 % (ref 11.5–15.5)
WBC: 7.9 10*3/uL (ref 4.0–10.5)

## 2017-11-22 LAB — BASIC METABOLIC PANEL
Anion gap: 11 (ref 5–15)
BUN: 9 mg/dL (ref 6–20)
CO2: 24 mmol/L (ref 22–32)
Calcium: 9.4 mg/dL (ref 8.9–10.3)
Chloride: 97 mmol/L — ABNORMAL LOW (ref 101–111)
Creatinine, Ser: 1 mg/dL (ref 0.44–1.00)
GFR calc Af Amer: 54 mL/min — ABNORMAL LOW (ref >60)
GFR calc non Af Amer: 46 mL/min — ABNORMAL LOW (ref >60)
Glucose, Bld: 129 mg/dL — ABNORMAL HIGH (ref 65–99)
Potassium: 3.6 mmol/L (ref 3.5–5.1)
Sodium: 132 mmol/L — ABNORMAL LOW (ref 135–145)

## 2017-11-22 LAB — HEPARIN LEVEL (UNFRACTIONATED)
Heparin Unfractionated: 0.46 IU/mL (ref 0.30–0.70)
Heparin Unfractionated: 0.42 IU/mL (ref 0.30–0.70)

## 2017-11-22 LAB — TSH: TSH: 2.628 u[IU]/mL (ref 0.350–4.500)

## 2017-11-22 LAB — TROPONIN I: Troponin I: 21.38 ng/mL (ref <0.03)

## 2017-11-22 MED ORDER — TICAGRELOR 90 MG PO TABS
90.0000 mg | ORAL_TABLET | Freq: Two times a day (BID) | ORAL | Status: DC
  Administered 2017-11-22 – 2017-11-23 (×3): 90 mg via ORAL
  Filled 2017-11-22 (×3): qty 1

## 2017-11-22 MED ORDER — ASPIRIN EC 81 MG PO TBEC
81.0000 mg | DELAYED_RELEASE_TABLET | Freq: Every day | ORAL | Status: DC
Start: 2017-11-22 — End: 2017-11-23
  Administered 2017-11-22 – 2017-11-23 (×2): 81 mg via ORAL
  Filled 2017-11-22 (×2): qty 1

## 2017-11-22 NOTE — Progress Notes (Signed)
ANTICOAGULATION CONSULT NOTE - Follow Up Consult  Pharmacy Consult for heparin Indication: chest pain/ACS  Labs: Recent Labs    11/21/17 1122 11/21/17 1137 11/21/17 1400 11/21/17 2225 11/22/17 0206  HGB 13.7  --   --   --  13.4  HCT 41.7  --   --   --  39.6  PLT 295  --   --   --  281  HEPARINUNFRC  --   --   --   --  0.46  CREATININE  --  1.08*  --   --   --   TROPONINI  --   --  13.71* 24.88*  --     Assessment/Plan:  83yo female therapeutic on heparin with initial dosing for CP. Will continue gtt at current rate and confirm stable with additional level.   Vernard Gambles, PharmD, BCPS  11/22/2017,2:58 AM

## 2017-11-22 NOTE — Progress Notes (Signed)
Pt transferred to 6 east room 27  Family aware  Pt has all belongings  Called dietary to have lunch tray transferred

## 2017-11-22 NOTE — Progress Notes (Signed)
Pt has rested throughout the night, easily aroused with not s/s of pain,b/p, troponin has been trending up and down. MD aware and is on appropriate treatment at this time. Hep drip and nitro drip maintained plan for Cardiologist orderd to consult today.

## 2017-11-22 NOTE — Progress Notes (Signed)
   Subjective: Pt reports no chest pain this am.  York Spaniel she felt funny last night light headed at one point but that passed.  She denies any shortness of breath, nausea, vom, diarrhea.    Objective:  Vital signs in last 24 hours: Vitals:   11/21/17 1719 11/21/17 1946 11/21/17 2300 11/22/17 0613  BP: (!) 184/67 114/60 (!) 177/71 (!) 176/67  Pulse: (!) 55 63 60 61  Resp: 18 18 18 18   Temp: 98.2 F (36.8 C) 98.1 F (36.7 C) 97.9 F (36.6 C) 98 F (36.7 C)  TempSrc: Oral Oral Oral Oral  SpO2: 96% 97% 94% 95%  Weight: 127 lb (57.6 kg)   127 lb 8 oz (57.8 kg)  Height: 5\' 2"  (1.575 m)        Assessment/Plan:  Principal Problem:   Chest pain  Physical Exam  Constitutional: She is oriented to person, place, and time. No distress.  Eyes: Right eye exhibits no discharge. Left eye exhibits no discharge. No scleral icterus.  Neck: JVD 6-7cm present.  Cardiovascular: Regular rhythm and normal heart sounds. Bradycardia present. Exam reveals no gallop and no friction rub.  No murmur heard. Pulmonary/Chest: Effort normal. No respiratory distress. She has no wheezes. She has no rhonchi. She has rales in the right lower field. She exhibits no tenderness.  Abdominal: Soft. Bowel sounds are normal. She exhibits no distension and no mass. There is no tenderness. There is no rebound and no guarding.  Neurological: She is alert and oriented to person, place, and time.  Skin: She is not diaphoretic.   NSTEMI: troponin peaked at 24 around midnight.  Pt said she felt lightheaded at one point during the night but has had no chest pain.  Cardiology recommends continued medical management  -continue brillinta and asa for 1 year -ECHO-if EF is preserved can discharge later.  If reduced will need 24 hours more of monitoring optimize fluid status and heart failure meds -can discontinue heparin this evening    PAF: chronic, diagnosed 8 years ago, pts cardiologist and family discussed and agreed no  anticoagulation   -PRN metoprolol if needed   GERD: pt on ranitidine, will switch to pantoprazole for better symptomatic control    Dispo: Anticipated discharge in approximately 1-2 day(s).   Angelita Ingles, MD 11/22/2017, 9:29 AM Thornell Mule MD PGY-1 Internal Medicine Pager # [phone]

## 2017-11-22 NOTE — Progress Notes (Addendum)
No s/s of chest pain. No distress. Restful night. Paged MD to notified about Critical troponin.

## 2017-11-22 NOTE — H&P (Signed)
  Date: 11/22/2017  Patient name: [name]  Medical record number: [id number]  Date of birth: Oct 17, 1920   I saw and evaluated the patient. I reviewed the resident's note and I agree with the resident's findings and plan as documented in the resident's note.  Chief Complaint(s): Chest tightness  History - key components related to admission:  Please see resident note for details.  Briefly, this is a 83 year old woman with a history of paroxysmal A. fib, hyperlipidemia, hypertension, who was brought in from her assisted living facility with chest pressure that began while eating breakfast. She felt dizzy and short of breath and palpitations. She received 3 nitroglycerin at the facility, and her chest pain had resolved by the time she was evaluated in the ED.  Physical Exam - key components related to admission:  Vitals:   11/21/17 2300 11/22/17 7972 11/22/17 1029 11/22/17 1208  BP: (!) 177/71 (!) 176/67 (!) 121/52 (!) 141/66  Pulse: 60 61 68 65  Resp: 18 18 19 15   Temp: 97.9 F (36.6 C) 98 F (36.7 C)  (!) 97.5 F (36.4 C)  TempSrc: Oral Oral  Oral  SpO2: 94% 95%  96%  Weight:  127 lb 8 oz (57.8 kg)    Height:        General: Alert, elderly, pleasant, not in distress HEENT: Moist mucous membranes Cardiovascular: Regular rate and rhythm, no murmurs rubs or gallops Pulmonary: Lung sounds diminished at both bases, normal work of breathing Abdominal: Soft, nontender, not distended, normal bowel sounds Skin: No significant rashes or lesions Neuro: Alert and oriented, speech normal  Labs and studies significant for troponin peaking at 24 overnight, EKG with new left bundle branch block  Assessment and Plan: I have seen and evaluated the patient as outlined above. I agree with the formulated Assessment and Plan as detailed in the residents' note, with the following changes:   Principal Problem:   NSTEMI (non-ST elevated myocardial infarction) (HCC)   1. NSTEMI: She  clearly presented with a significant NSTEMI, and her case was discussed immediately with cardiology. However, even if she were a candidate for PCI, she was not interested in a catheterization. We will continue to manage her medically and ensure she is stable before discharging her back to her facility. -Continue heparin drip -Continued total antiplatelet therapy -TTE today to evaluate post MI function -Telemetry to watch for arrhythmias  Anne Shutter, MD 12/17/20182:12 PM

## 2017-11-22 NOTE — Progress Notes (Signed)
  Echocardiogram 2D Echocardiogram has been performed.  [name] 11/22/2017, 6:13 PM

## 2017-11-22 NOTE — Progress Notes (Signed)
MD returned page. Aware the unit does not except nitro drips. Order needed to discontinue or transfer pt  MD aware pt reports chest pain free  MD also aware of elevated troponin

## 2017-11-22 NOTE — Progress Notes (Signed)
Paged MD regarding pt on nitro drip  Pt states she has been chest pain free yesterday and this morning

## 2017-11-22 NOTE — Plan of Care (Signed)
  Nutrition: Adequate nutrition will be maintained 11/22/2017 0211 - Progressing by Sherian Maroon, RN   Pain Managment: General experience of comfort will improve 11/22/2017 0211 - Progressing by Sherian Maroon, RN Note Pt denies pain   Safety: Ability to remain free from injury will improve 11/22/2017 0211 - Progressing by Sherian Maroon, RN

## 2017-11-22 NOTE — Progress Notes (Signed)
ANTICOAGULATION CONSULT NOTE  Pharmacy Consult for heparin  Indication: chest pain/ACS  Allergies  Allergen Reactions  . Amoxicillin Itching    Patient Measurements: Height: 5\' 2"  (157.5 cm) Weight: 127 lb 8 oz (57.8 kg) IBW/kg (Calculated) : 50.1   Vital Signs: Temp: 98 F (36.7 C) (12/17 0613) Temp Source: Oral (12/17 0613) BP: 176/67 (12/17 0613) Pulse Rate: 61 (12/17 0613)  Labs: Recent Labs    11/21/17 1122 11/21/17 1137 11/21/17 1400 11/21/17 2225 11/22/17 0206 11/22/17 0736  HGB 13.7  --   --   --  13.4  --   HCT 41.7  --   --   --  39.6  --   PLT 295  --   --   --  281  --   HEPARINUNFRC  --   --   --   --  0.46 0.42  CREATININE  --  1.08*  --   --  1.00  --   TROPONINI  --   --  13.71* 24.88* 21.38*  --     Estimated Creatinine Clearance: 26.6 mL/min (by C-G formula based on SCr of 1 mg/dL).   Medical History: Past Medical History:  Diagnosis Date  . Arthritis   . Atrial fibrillation (HCC)   . GERD (gastroesophageal reflux disease)   . Hiatal hernia   . HLD (hyperlipidemia)   . HTN (hypertension)   . S/P dilatation of esophageal stricture   . Thyroid disorder     Medications:  Medications Prior to Admission  Medication Sig Dispense Refill Last Dose  . aspirin EC 325 MG tablet Take 325 mg by mouth daily.   11/21/2017 at Unknown time  . Cholecalciferol (VITAMIN D) 2000 units CAPS Take 2,000 Units by mouth 2 (two) times daily.    11/21/2017 at Unknown time  . co-enzyme Q-10 30 MG capsule Take 30 mg by mouth daily.     11/21/2017 at Unknown time  . diclofenac sodium (VOLTAREN) 1 % GEL Place 1 application onto the skin as needed for other. For Osteoarthritis in the knees   PRN  . guaiFENesin (MUCINEX) 600 MG 12 hr tablet Take 600 mg by mouth 2 (two) times daily.   11/21/2017 at Unknown time  . lisinopril (PRINIVIL,ZESTRIL) 10 MG tablet Take 10 mg by mouth daily.    11/21/2017 at Unknown time  . naproxen sodium (ALEVE) 220 MG tablet Take 220 mg by  mouth at bedtime.   11/20/2017 at Unknown time  . nitroGLYCERIN (NITROSTAT) 0.4 MG SL tablet Place 0.4 mg under the tongue every 5 (five) minutes as needed for chest pain.   11/21/2017 at Unknown time  . oxybutynin (DITROPAN) 5 MG tablet Take 5 mg by mouth 3 (three) times daily. For bladder  0 11/21/2017 at Unknown time  . RaNITidine HCl (RANITIDINE 150 MAX STRENGTH PO) Take 150 mg by mouth at bedtime.   11/20/2017 at Unknown time  . SENNA PO Take 1 tablet by mouth daily as needed (constipation).   Past Month at Unknown time   Scheduled:  . aspirin EC  81 mg Oral Daily  . atorvastatin  80 mg Oral q1800  . cholecalciferol  2,000 Units Oral BID  . lisinopril  10 mg Oral Daily  . oxybutynin  5 mg Oral TID  . pantoprazole  40 mg Oral Daily  . ticagrelor  90 mg Oral BID    Assessment: 83 yo female with troponin peaked ~ 24. Pharmacy consulted to dose heparin. No anticoagulants noted PTA.  CBC stable this morning with no overt bleeding reported. Patient is still on nitro infusion and transferring to step down unit. -Hg= 13.4, plt= 281  Goal of Therapy:  Heparin level 0.3-0.7 units/ml Monitor platelets by anticoagulation protocol: Yes   Plan:  -Continue heparin gtt at 700 units/hr -Heparin level and CBC daily -Monitor for s/sx of bleeding  Ruben Imony Othell Diluzio, PharmD Clinical Pharmacist 11/22/2017 10:05 AM

## 2017-11-22 NOTE — Progress Notes (Signed)
Report given to Rennis Petty RN  Pt belongings packed  Called to update the family, spoke to son

## 2017-11-23 LAB — BASIC METABOLIC PANEL
Anion gap: 11 (ref 5–15)
BUN: 14 mg/dL (ref 6–20)
CO2: 23 mmol/L (ref 22–32)
Calcium: 9.4 mg/dL (ref 8.9–10.3)
Chloride: 98 mmol/L — ABNORMAL LOW (ref 101–111)
Creatinine, Ser: 1.16 mg/dL — ABNORMAL HIGH (ref 0.44–1.00)
GFR calc Af Amer: 45 mL/min — ABNORMAL LOW (ref >60)
GFR calc non Af Amer: 39 mL/min — ABNORMAL LOW (ref >60)
Glucose, Bld: 159 mg/dL — ABNORMAL HIGH (ref 65–99)
Potassium: 4 mmol/L (ref 3.5–5.1)
Sodium: 132 mmol/L — ABNORMAL LOW (ref 135–145)

## 2017-11-23 LAB — ECHOCARDIOGRAM COMPLETE
Height: 62 in
Weight: 2040 oz

## 2017-11-23 LAB — CBC
HCT: 41.3 % (ref 36.0–46.0)
Hemoglobin: 14 g/dL (ref 12.0–15.0)
MCH: 28.1 pg (ref 26.0–34.0)
MCHC: 33.9 g/dL (ref 30.0–36.0)
MCV: 82.9 fL (ref 78.0–100.0)
Platelets: 329 10*3/uL (ref 150–400)
RBC: 4.98 MIL/uL (ref 3.87–5.11)
RDW: 14.4 % (ref 11.5–15.5)
WBC: 11.3 10*3/uL — ABNORMAL HIGH (ref 4.0–10.5)

## 2017-11-23 LAB — HEPARIN LEVEL (UNFRACTIONATED): Heparin Unfractionated: 0.31 IU/mL (ref 0.30–0.70)

## 2017-11-23 MED ORDER — ATORVASTATIN CALCIUM 80 MG PO TABS: 80.0000 mg | ORAL_TABLET | Freq: Every day | ORAL | 0 refills | Status: DC

## 2017-11-23 MED ORDER — PANTOPRAZOLE SODIUM 40 MG PO TBEC: 40.0000 mg | DELAYED_RELEASE_TABLET | Freq: Every day | ORAL | 0 refills | Status: AC

## 2017-11-23 MED ORDER — TICAGRELOR 90 MG PO TABS: 90.0000 mg | ORAL_TABLET | Freq: Two times a day (BID) | ORAL | 0 refills | Status: AC

## 2017-11-23 MED ORDER — METOPROLOL TARTRATE 5 MG/5ML IV SOLN
5.0000 mg | INTRAVENOUS | Status: DC | PRN
  Administered 2017-11-23 (×2): 5 mg via INTRAVENOUS
  Filled 2017-11-23 (×2): qty 5

## 2017-11-23 MED ORDER — ASPIRIN 81 MG PO TBEC: 81.0000 mg | DELAYED_RELEASE_TABLET | Freq: Every day | ORAL | 0 refills | Status: AC

## 2017-11-23 NOTE — Discharge Summary (Signed)
Name: [name] MRN: [id number] DOB: [birth year] 83 y.o. PCP: Patient, No Pcp Per  Date of Admission: 11/21/2017 11:03 AM Date of Discharge: 11/23/2017 Attending Physician: Anne Shutter, MD  Discharge Diagnosis: 1.  Principal Problem:   NSTEMI (non-ST elevated myocardial infarction) [facility]) Active Problems:   Chest pain   Discharge Medications: Allergies as of 11/23/2017      Reactions   Amoxicillin Itching      Medication List    STOP taking these medications   naproxen sodium 220 MG tablet Commonly known as:  ALEVE   RANITIDINE 150 MAX STRENGTH PO     TAKE these medications   aspirin 81 MG EC tablet Take 1 tablet (81 mg total) by mouth daily. What changed:    medication strength  how much to take   atorvastatin 80 MG tablet Commonly known as:  LIPITOR Take 1 tablet (80 mg total) by mouth daily at 6 PM.   co-enzyme Q-10 30 MG capsule Take 30 mg by mouth daily.   diclofenac sodium 1 % Gel Commonly known as:  VOLTAREN Place 1 application onto the skin as needed for other. For Osteoarthritis in the knees   guaiFENesin 600 MG 12 hr tablet Commonly known as:  MUCINEX Take 600 mg by mouth 2 (two) times daily.   lisinopril 10 MG tablet Commonly known as:  PRINIVIL,ZESTRIL Take 10 mg by mouth daily.   nitroGLYCERIN 0.4 MG SL tablet Commonly known as:  NITROSTAT Place 0.4 mg under the tongue every 5 (five) minutes as needed for chest pain.   oxybutynin 5 MG tablet Commonly known as:  DITROPAN Take 5 mg by mouth 3 (three) times daily. For bladder   pantoprazole 40 MG tablet Commonly known as:  PROTONIX Take 1 tablet (40 mg total) by mouth daily. Start taking on:  11/24/2017   SENNA PO Take 1 tablet by mouth daily as needed (constipation).   ticagrelor 90 MG Tabs tablet Commonly known as:  BRILINTA Take 1 tablet (90 mg total) by mouth 2 (two) times daily.   Vitamin D 2000 units Caps Take 2,000 Units by mouth 2 (two) times daily.         Disposition and follow-up:   [name] was discharged from [facility] in Good condition.  At the hospital follow up visit please address:  1.   NSTEMI -continue to monitor pts volume status -continue dual antiplatelet therapy for one year -continue to screen for chest pain  PAF -contnue to monitor for now  -pt did not tolerate beta blocker here in hospital due to bradycardia but may need to add later, was mostly in sinus rhythm during her stay -family and pts cardiologist determined not a good candidate for anticoagulation  2.  Labs / imaging needed at time of follow-up:bmp  3.  Pending labs/ test needing follow-up: none  Follow-up Appointments: Follow-up Information    Cloward, Laban Emperor, MD. Schedule an appointment as soon as possible for a visit in 1 week(s).   Specialty:  Internal Medicine Why:  follow up with PCP in around one week Contact information: [address] [place] [address] [phone]           Hospital Course by problem list: Principal Problem:   NSTEMI (non-ST elevated myocardial infarction) [facility]) Active Problems:   Chest pain   1.   NSTEMI Patient arrived to the emergency department from her assisted living facility with chest tightness. She had a new left bundle  branch block, mild initial elevation in troponin.  She received 3 sublingual nitroglycerin tabs before her arrival to the ED. since her arrival she remained chest pain free throughout her hospitalization.  Fortunately her troponins trended up peaking at about 24.0.  In talking with the patient, the family, and cardiology all parties involved felt that not pursuing left heart catheterization was the proper management.  Therefore we treated the patient medically, she was already on ACE inhibitor, we started a high intensity statin, started the patient on heparin, Brilinta and aspirin.  We ordered an echocardiogram for the following day he was not performed until  the morning of her discharge and it showed a preserved ejection fraction with no regional wall motion abnormalities.  She was discharged home with good volume status, chest pain free on her home ACE inhibitor, statin,  Brilinta and aspirin for 1 year.    PAF Patient arrived initially in atrial fibrillation with RVR up to a rate of 120.  She subsequently self converted to sinus bradycardia.  The family and pts cardiologist agreed the risks of anticoagulating the patient outweighed the benefits.  She remained in sinus rhythm for most of her hospitalization only requiring IV metoprolol 2 occasions with good response and conversion to sinus rhythm.     Discharge Vitals:   BP 139/90 (BP Location: Right Arm)   Pulse (!) 109   Temp 98.7 F (37.1 C) (Oral)   Resp (!) 24   Ht 5\' 2"  (1.575 m)   Wt 128 lb 3.2 oz (58.2 kg)   SpO2 95%   BMI 23.45 kg/m   Pertinent Labs, Studies, and Procedures:  BMP Latest Ref Rng & Units 11/23/2017 11/22/2017 11/21/2017  Glucose 65 - 99 mg/dL 782(N159(H) 562(Z129(H) 308(M169(H)  BUN 6 - 20 mg/dL 14 9 7   Creatinine 0.44 - 1.00 mg/dL 5.78(I1.16(H) 6.961.00 2.95(M1.08(H)  Sodium 135 - 145 mmol/L 132(L) 132(L) 134(L)  Potassium 3.5 - 5.1 mmol/L 4.0 3.6 3.8  Chloride 101 - 111 mmol/L 98(L) 97(L) 101  CO2 22 - 32 mmol/L 23 24 25   Calcium 8.9 - 10.3 mg/dL 9.4 9.4 9.2   CBC Latest Ref Rng & Units 11/23/2017 11/22/2017 11/21/2017  WBC 4.0 - 10.5 K/uL 11.3(H) 7.9 8.7  Hemoglobin 12.0 - 15.0 g/dL 84.114.0 32.413.4 40.113.7  Hematocrit 36.0 - 46.0 % 41.3 39.6 41.7  Platelets 150 - 400 K/uL 329 281 295   Troponins 21.38 Critically high  24.88 Critically high  CM13.71 Critically high   Initial 0.25   ECHO Study Conclusions   - Left ventricle: The cavity size was normal. Wall thickness was   increased in a pattern of mild LVH. Systolic function was normal.   The estimated ejection fraction was in the range of 50% to 55%.   Wall motion was normal; there were no regional wall motion   abnormalities. Doppler  parameters are consistent with abnormal   left ventricular relaxation (grade 1 diastolic dysfunction). - Ventricular septum: Septal motion showed abnormal function and   dyssynergy. - Aortic valve: Mildly thickened, mildly calcified leaflets. There   was mild regurgitation. - Mitral valve: Calcified annulus. Mildly thickened, mildly   calcified leaflets . There was mild regurgitation. - Left atrium: The atrium was mildly dilated. - Pulmonary arteries: Systolic pressure was mildly increased. PA   peak pressure: 32 mm Hg (S).  CHEST  2 VIEW   COMPARISON:  04/02/2015   FINDINGS: The cardiac silhouette is enlarged. Calcific atherosclerotic disease and tortuosity of the aorta.  There is no evidence of focal airspace consolidation, pleural effusion or pneumothorax. Low lung volume with bilateral lower lobe atelectasis. Mild chronic prominence of the interstitium.   Healed right-sided rib fractures.  Soft tissues are grossly normal.   IMPRESSION: Enlarged cardiac silhouette.   Mild chronic prominence of the interstitium, with low lung volumes and mild bibasilar atelectasis   Discharge Instructions: Discharge Instructions    (HEART FAILURE PATIENTS) Call MD:  Anytime you have any of the following symptoms: 1) 3 pound weight gain in 24 hours or 5 pounds in 1 week 2) shortness of breath, with or without a dry hacking cough 3) swelling in the hands, feet or stomach 4) if you have to sleep on extra pillows at night in order to breathe.   Complete by:  As directed    Call MD for:   Complete by:  As directed    Call MD for:  difficulty breathing, headache or visual disturbances   Complete by:  As directed    Call MD for:  extreme fatigue   Complete by:  As directed    Call MD for:  hives   Complete by:  As directed    Call MD for:  persistant dizziness or light-headedness   Complete by:  As directed    Call MD for:  persistant nausea and vomiting   Complete by:  As directed     Call MD for:  redness, tenderness, or signs of infection (pain, swelling, redness, odor or green/yellow discharge around incision site)   Complete by:  As directed    Call MD for:  severe uncontrolled pain   Complete by:  As directed    Call MD for:  temperature >100.4   Complete by:  As directed    Diet - low sodium heart healthy   Complete by:  As directed    Discharge instructions   Complete by:  As directed    Continue dual antiplatelet therapy as outlined in discharge instructions, follow-up with your PCP in 1 week.  Follow all other recommendations in discharge summary.   Increase activity slowly   Complete by:  As directed       Signed: Angelita Ingles, MD 11/23/2017, 12:34 PM

## 2017-11-23 NOTE — Progress Notes (Signed)
Pt attempting to get OOB, confused as to where she is and situation. At same time-Pt noted to be AFIB RVR w/HR 130-140's. Pt states she thought she heard her son laughing and was trying to go find him. Pt is oriented to self only. Assisted back to bed. BP 161/90's O2 sat 96% on RA. Pt currently on IV heparin and Ntg gtt. MD on call paged through Am[facility] to inform of above. Will continue to monitor. Dierdre Highman, RN

## 2017-11-23 NOTE — Progress Notes (Signed)
Pt given Metoprolol 5 mg IV x1 dose at 0342 per order. Pt remains in Afib but HR sustaining 90's to low 100's, occasionally up to 120. Pt resting comfortably at this time, will continue to monitor. Dierdre Highman, RN

## 2017-11-23 NOTE — Clinical Social Work Note (Signed)
Clinical Social Work Assessment  Patient Details  Name: [name] MRN: [id number] Date of Birth: [birth year]  Date of referral:  11/23/17               Reason for consult:  Facility Placement(from [facility])                Permission sought to share information with:  Facility Sport and exercise psychologist, Family Supports Permission granted to share information::  Yes, Verbal Permission Granted  Name::     [name]  Agency::  [employer]  Relationship::  son  Contact Information:  [phone]  Housing/Transportation Living arrangements for the past 2 months:  [place] of Information:  Facility, Adult Children Patient Interpreter Needed:  None Criminal Activity/Legal Involvement Pertinent to Current Situation/Hospitalization:  No - Comment as needed Significant Relationships:  Adult Children Lives with:  Facility Resident Do you feel safe going back to the place where you live?  Yes Need for family participation in patient care:  Yes (Comment)  Care giving concerns: Patient from [place].   Social Worker assessment / plan: CSW met with patient and son, [name], at bedside. Patient alert, only oriented to self, though with pleasant demeanor. Patient and son agreeable for patient to discharge back to [facility] today. CSW spoke to admissions at facility and they are ready for patient to return today. CSW to support with discharge.  Employment status:  Retired Forensic scientist:  Medicare PT Recommendations:  Not assessed at this time [place] / Referral to community resources:  Other (Comment Required)(back to ALF)  Patient/Family's Response to care: Patient and son appreciative of care.  Patient/Family's Understanding of and Emotional Response to Diagnosis, Current Treatment, and Prognosis: Patient disoriented; son with questions about patient's condition and concern about patient's confusion. Son eager to  speak with MD regarding patient's condition and treatment.  Emotional Assessment Appearance:  Appears stated age Attitude/Demeanor/Rapport:  Other(appropriate) Affect (typically observed):  Calm, Pleasant Orientation:  Oriented to Self Alcohol / Substance use:  Not Applicable Psych involvement (Current and /or in the community):  No (Comment)  Discharge Needs  Concerns to be addressed:  Discharge Planning Concerns, Care Coordination Readmission within the last 30 days:  No Current discharge risk:  Cognitively Impaired Barriers to Discharge:  No Barriers Identified   Estanislado Emms, LCSW 11/23/2017, 10:43 AM

## 2017-11-23 NOTE — Clinical Social Work Placement (Signed)
   CLINICAL SOCIAL WORK PLACEMENT  NOTE  Date:  11/23/2017  Patient Details  Name: [name] MRN: [id number] Date of Birth: 05/01/1920  Clinical Social Work is seeking post-discharge placement for this patient at the Assisted Living Facility level of care (*CSW will initial, date and re-position this form in  chart as items are completed):  Yes   Patient/family provided with [facility] Clinical Social Work Department's list of facilities offering this level of care within the geographic area requested by the patient (or if unable, by the patient's family).  Yes   Patient/family informed of their freedom to choose among providers that offer the needed level of care, that participate in Medicare, Medicaid or managed care program needed by the patient, have an available bed and are willing to accept the patient.  Yes   Patient/family informed of [facility]'s ownership interest in [facility] and [facility], as well as of the fact that they are under no obligation to receive care at these facilities.  PASRR submitted to EDS on       PASRR number received on       Existing PASRR number confirmed on 11/23/17     FL2 transmitted to all facilities in geographic area requested by pt/family on 11/23/17     FL2 transmitted to all facilities within larger geographic area on       Patient informed that his/her managed care company has contracts with or will negotiate with certain facilities, including the following:  [facility] informed of bed offers received.  Patient chooses bed at [facility]     Physician recommends and patient chooses bed at      Patient to be transferred to [facility] on 11/23/17.  Patient to be transferred to facility by       Patient family notified on 11/23/17 of transfer.  Name of family member notified:  Bernarda Caffey, son     PHYSICIAN Please prepare priority discharge summary, including  medications, Please prepare prescriptions, Please sign FL2, Please sign DNR     Additional Comment:    _______________________________________________ Abigail Butts, LCSW 11/23/2017, 10:17 AM

## 2017-11-23 NOTE — Progress Notes (Signed)
   Subjective: Pt reports no chest pain this am.  [place] Spaniel she felt a little "woozy earlier" but that passed.  She denies any shortness of breath, nausea, vom, diarrhea.  She did pull her IV out this morning.  She was back in afib earlier this morning but was asymptomatic.    Objective:  Vital signs in last 24 hours: Vitals:   11/22/17 1400 11/22/17 2041 11/22/17 2230 11/23/17 0521  BP:  (!) 186/95 (!) 170/84 139/90  Pulse: 69 (!) 54  (!) 109  Resp: (!) 21 16  (!) 24  Temp:  (!) 97.5 F (36.4 C)  98.7 F (37.1 C)  TempSrc:  Oral  Oral  SpO2: 96% 95%  95%  Weight:    128 lb 3.2 oz (58.2 kg)  Height:         Assessment/Plan:  Principal Problem:   NSTEMI (non-ST elevated myocardial infarction) (HCC) Active Problems:   Chest pain  Physical Exam  Constitutional: She is oriented to person, place, and time. No distress.  Eyes: Right eye exhibits no discharge. Left eye exhibits no discharge. No scleral icterus.  Neck: JVD 6cm present.  Cardiovascular: Regular rhythm and normal heart sounds. normal rate. Exam reveals no gallop and no friction rub.  No murmur heard. Pulmonary/Chest: Effort normal. No respiratory distress. She has no wheezes. She has no rhonchi. She has no rales. She exhibits no tenderness.  Abdominal: Soft. Bowel sounds are normal. She exhibits no distension and no mass. There is no tenderness. There is no rebound and no guarding.  Neurological: She is alert and oriented to person, place, and time.  Skin: She is not diaphoretic.   NSTEMI: troponin peaked at 24 around midnight.  Pt said she felt lightheaded at one point during the night but has had no chest pain.  Cardiology recommends continued medical management  -continues to be chest pain free -continue brillinta and asa for 1 year -EF preserved on ECHO no regional wall motion abnormalities, volume status looks good today   PAF: chronic, diagnosed 8 years ago, pts cardiologist and family discussed and agreed no  anticoagulation   -PRN metoprolol if needed -got one 5mg  dose of metoprolol IV and converted to sinus   GERD: pt on ranitidine, will switch to pantoprazole for better symptomatic control    Dispo: Medically stable with Anticipated discharge later today.   Angelita Ingles, MD 11/23/2017, 10:31 AM Thornell Mule MD PGY-1 Internal Medicine Pager # [phone]

## 2017-11-23 NOTE — Care Management Note (Signed)
Case Management Note  Patient Details  Name: AUDRINNA HOFFMEIER MRN: [id number] Date of Birth: 12-Aug-1920  Subjective/Objective:                 Patient to return to ALF. Discussed with son at bedside Brilinta coupon. They understand use of coupon and to ask what cost will be in Jan when deductable starts over again.   Action/Plan:  DC to ALF Expected Discharge Date:  11/23/17               Expected Discharge Plan:  Assisted Living / Rest Home  In-House Referral:  Clinical Social Work  Discharge planning Services  CM Consult, Medication Assistance  Post Acute Care Choice:    Choice offered to:     DME Arranged:    DME Agency:     HH Arranged:    HH Agency:     Status of Service:  Completed, signed off  If discussed at [employer], dates discussed:    Additional Comments:  Lawerance Sabal, RN 11/23/2017, 1:11 PM

## 2017-11-23 NOTE — Care Management Note (Signed)
Case Management Note  Patient Details  Name: BREYONA LIENHART MRN: [id number] Date of Birth: 05-29-1920  Subjective/Objective:                    Action/Plan:   Expected Discharge Date:  11/23/17               Expected Discharge Plan:  Assisted Living / Rest Home  In-House Referral:  Clinical Social Work  Discharge planning Services  CM Consult, Medication Assistance  Post Acute Care Choice:    Choice offered to:     DME Arranged:    DME Agency:     HH Arranged:    HH Agency:     Status of Service:  Completed, signed off  If discussed at [employer], dates discussed:    Additional Comments:  Lawerance Sabal, RN 11/23/2017, 1:08 PM

## 2017-11-23 NOTE — Progress Notes (Signed)
Patient will discharge to [facility]. Anticipated discharge date: 11/23/17 Family notified: Madinah Dionicio, son Transportation by: son will drive  Nurse to call report to [phone].   CSW signing off.  Abigail Butts, LCSWA  Clinical Social Worker

## 2017-11-23 NOTE — [place] FL2 (Signed)
[place] MEDICAID FL2 LEVEL OF CARE SCREENING TOOL     IDENTIFICATION  Patient Name: RIJA SHAMBAUGH Birthdate: 08-23-1920 Sex: female Admission Date (Current Location): 11/21/2017  [facility] and [place] Number:  Producer, television/film/video and Address:  [facility], [address] [place], [place] [address]      Provider Number: [id number]  Attending Physician Name and Address:  Anne Shutter, MD  Relative Name and Phone Number:  Kimaya Pipia, son, [phone]     Current Level of Care: Hospital Recommended Level of Care: Assisted Living Facility Prior Approval Number:    Date Approved/Denied:   PASRR Number: [id number]  Discharge Plan: Domiciliary (Rest home)(ALF)    Current Diagnoses: Patient Active Problem List   Diagnosis Date Noted  . Chest pain 11/22/2017  . NSTEMI (non-ST elevated myocardial infarction) (HCC) 11/21/2017  . Fall 04/03/2015  . Acute blood loss anemia 04/03/2015  . Multiple rib fractures 04/01/2015  . GERD with stricture 11/23/2011  . HYPERLIPIDEMIA 03/07/2010  . Essential hypertension 03/07/2010    Orientation RESPIRATION BLADDER Height & Weight     Self  Normal Continent Weight: 128 lb 3.2 oz (58.2 kg) Height:  5\' 2"  (157.5 cm)  BEHAVIORAL SYMPTOMS/MOOD NEUROLOGICAL BOWEL NUTRITION STATUS      Continent Diet(regular diet)  AMBULATORY STATUS COMMUNICATION OF NEEDS Skin   (baseline) Verbally Normal                       Personal Care Assistance Level of Assistance  Bathing, Feeding, Dressing(baseline)           Functional Limitations Info  Sight, Hearing, Speech Sight Info: Adequate Hearing Info: Adequate Speech Info: Adequate    SPECIAL CARE FACTORS FREQUENCY                       Contractures Contractures Info: Not present    Additional Factors Info  Code Status, Allergies Code Status Info: DNR Allergies Info: Amoxicillin           Current Medications (11/23/2017):  This is  the current hospital active medication list Current Facility-Administered Medications  Medication Dose Route Frequency Provider Last Rate Last Dose  . acetaminophen (TYLENOL) tablet 650 mg  650 mg Oral Q6H PRN Nyra Market, MD       Or  . acetaminophen (TYLENOL) suppository 650 mg  650 mg Rectal Q6H PRN Nyra Market, MD      . aspirin EC tablet 81 mg  81 mg Oral Daily Angelita Ingles, MD   81 mg at 11/23/17 1008  . atorvastatin (LIPITOR) tablet 80 mg  80 mg Oral q1800 Nyra Market, MD   80 mg at 11/22/17 1730  . cholecalciferol (VITAMIN D) tablet 2,000 Units  2,000 Units Oral BID Nyra Market, MD   2,000 Units at 11/23/17 1008  . heparin ADULT infusion 100 units/mL (25000 units/293mL sodium chloride 0.45%)  700 Units/hr Intravenous Continuous Silvana Newness, RPH 7 mL/hr at 11/22/17 1730 700 Units/hr at 11/22/17 1730  . lisinopril (PRINIVIL,ZESTRIL) tablet 10 mg  10 mg Oral Daily Nyra Market, MD   10 mg at 11/23/17 1008  . metoprolol tartrate (LOPRESSOR) injection 5 mg  5 mg Intravenous Q10 min PRN Ginger Carne, MD   5 mg at 11/23/17 0710  . nitroGLYCERIN 50 mg in dextrose 5 % 250 mL (0.2 mg/mL) infusion  0-200 mcg/min Intravenous Titrated Nyra Market, MD 1.5 mL/hr at 11/21/17 1831 5 mcg/min  at 11/21/17 1831  . oxybutynin (DITROPAN) tablet 5 mg  5 mg Oral TID Nyra MarketSvalina, Gorica, MD   5 mg at 11/23/17 1008  . pantoprazole (PROTONIX) EC tablet 40 mg  40 mg Oral Daily Nyra MarketSvalina, Gorica, MD   40 mg at 11/23/17 1008  . senna (SENOKOT) tablet 8.6 mg  1 tablet Oral Daily PRN Nyra MarketSvalina, Gorica, MD      . ticagrelor (BRILINTA) tablet 90 mg  90 mg Oral BID Nyra MarketSvalina, Gorica, MD   90 mg at 11/23/17 1008     Discharge Medications: Please see discharge summary for a list of discharge medications.  Relevant Imaging Results:  Relevant Lab Results:   Additional Information SSN: [id number]  Abigail ButtsSusan Seward Coran, LCSW

## 2017-11-23 NOTE — Progress Notes (Signed)
ANTICOAGULATION CONSULT NOTE  Pharmacy Consult for heparin  Indication: chest pain/ACS  Allergies  Allergen Reactions  . Amoxicillin Itching    Patient Measurements: Height: 5\' 2"  (157.5 cm) Weight: 128 lb 3.2 oz (58.2 kg) IBW/kg (Calculated) : 50.1   Vital Signs: Temp: 98.7 F (37.1 C) (12/18 0521) Temp Source: Oral (12/18 0521) BP: 139/90 (12/18 0521) Pulse Rate: 109 (12/18 0521)  Labs: Recent Labs    11/21/17 1122 11/21/17 1137 11/21/17 1400 11/21/17 2225 11/22/17 0206 11/22/17 0736 11/23/17 0517  HGB 13.7  --   --   --  13.4  --  14.0  HCT 41.7  --   --   --  39.6  --  41.3  PLT 295  --   --   --  281  --  329  HEPARINUNFRC  --   --   --   --  0.46 0.42 0.31  CREATININE  --  1.08*  --   --  1.00  --  1.16*  TROPONINI  --   --  13.71* 24.88* 21.38*  --   --     Estimated Creatinine Clearance: 22.9 mL/min (A) (by C-G formula based on SCr of 1.16 mg/dL (H)).    Assessment: 83 yo female with troponin peaked ~ 24. Continues on heparin Heparin level therapeutic, CBC stable  Goal of Therapy:  Heparin level 0.3-0.7 units/ml Monitor platelets by anticoagulation protocol: Yes   Plan:  -Continue heparin gtt at 700 units/hr -Heparin level and CBC daily -Monitor for s/sx of bleeding  Thank you Okey Regal, PharmD [phone] 11/23/2017 8:37 AM

## 2017-12-28 ENCOUNTER — Encounter (HOSPITAL_COMMUNITY): Payer: Self-pay | Admitting: General Practice

## 2017-12-28 ENCOUNTER — Other Ambulatory Visit: Payer: Self-pay

## 2017-12-28 ENCOUNTER — Emergency Department (HOSPITAL_COMMUNITY): Payer: Medicare Other

## 2017-12-28 ENCOUNTER — Inpatient Hospital Stay (HOSPITAL_COMMUNITY)
Admission: EM | Admit: 2017-12-28 | Discharge: 2017-12-31 | DRG: 871 | Disposition: A | Discharge status: Hospice | Payer: Medicare Other | Attending: Internal Medicine | Admitting: Internal Medicine

## 2017-12-28 DIAGNOSIS — I5041 Acute combined systolic (congestive) and diastolic (congestive) heart failure: Secondary | ICD-10-CM | POA: Diagnosis not present

## 2017-12-28 DIAGNOSIS — K222 Esophageal obstruction: Secondary | ICD-10-CM | POA: Diagnosis present

## 2017-12-28 DIAGNOSIS — I5033 Acute on chronic diastolic (congestive) heart failure: Secondary | ICD-10-CM | POA: Diagnosis not present

## 2017-12-28 DIAGNOSIS — I361 Nonrheumatic tricuspid (valve) insufficiency: Secondary | ICD-10-CM | POA: Diagnosis not present

## 2017-12-28 DIAGNOSIS — I10 Essential (primary) hypertension: Secondary | ICD-10-CM | POA: Diagnosis not present

## 2017-12-28 DIAGNOSIS — T501X5A Adverse effect of loop [high-ceiling] diuretics, initial encounter: Secondary | ICD-10-CM | POA: Diagnosis present

## 2017-12-28 DIAGNOSIS — Z515 Encounter for palliative care: Secondary | ICD-10-CM | POA: Diagnosis not present

## 2017-12-28 DIAGNOSIS — I252 Old myocardial infarction: Secondary | ICD-10-CM

## 2017-12-28 DIAGNOSIS — K449 Diaphragmatic hernia without obstruction or gangrene: Secondary | ICD-10-CM | POA: Diagnosis present

## 2017-12-28 DIAGNOSIS — E871 Hypo-osmolality and hyponatremia: Secondary | ICD-10-CM | POA: Diagnosis present

## 2017-12-28 DIAGNOSIS — E1165 Type 2 diabetes mellitus with hyperglycemia: Secondary | ICD-10-CM | POA: Diagnosis present

## 2017-12-28 DIAGNOSIS — R627 Adult failure to thrive: Secondary | ICD-10-CM | POA: Diagnosis present

## 2017-12-28 DIAGNOSIS — Z7189 Other specified counseling: Secondary | ICD-10-CM | POA: Diagnosis not present

## 2017-12-28 DIAGNOSIS — E872 Acidosis: Secondary | ICD-10-CM | POA: Diagnosis present

## 2017-12-28 DIAGNOSIS — K219 Gastro-esophageal reflux disease without esophagitis: Secondary | ICD-10-CM | POA: Diagnosis present

## 2017-12-28 DIAGNOSIS — Z881 Allergy status to other antibiotic agents status: Secondary | ICD-10-CM

## 2017-12-28 DIAGNOSIS — J189 Pneumonia, unspecified organism: Secondary | ICD-10-CM | POA: Diagnosis not present

## 2017-12-28 DIAGNOSIS — I481 Persistent atrial fibrillation: Secondary | ICD-10-CM | POA: Diagnosis present

## 2017-12-28 DIAGNOSIS — D649 Anemia, unspecified: Secondary | ICD-10-CM | POA: Diagnosis present

## 2017-12-28 DIAGNOSIS — E785 Hyperlipidemia, unspecified: Secondary | ICD-10-CM | POA: Diagnosis present

## 2017-12-28 DIAGNOSIS — N189 Chronic kidney disease, unspecified: Secondary | ICD-10-CM | POA: Diagnosis not present

## 2017-12-28 DIAGNOSIS — Z66 Do not resuscitate: Secondary | ICD-10-CM | POA: Diagnosis present

## 2017-12-28 DIAGNOSIS — I340 Nonrheumatic mitral (valve) insufficiency: Secondary | ICD-10-CM | POA: Diagnosis present

## 2017-12-28 DIAGNOSIS — I5043 Acute on chronic combined systolic (congestive) and diastolic (congestive) heart failure: Secondary | ICD-10-CM | POA: Diagnosis present

## 2017-12-28 DIAGNOSIS — I480 Paroxysmal atrial fibrillation: Secondary | ICD-10-CM | POA: Diagnosis present

## 2017-12-28 DIAGNOSIS — Z9181 History of falling: Secondary | ICD-10-CM

## 2017-12-28 DIAGNOSIS — I110 Hypertensive heart disease with heart failure: Secondary | ICD-10-CM | POA: Diagnosis present

## 2017-12-28 DIAGNOSIS — R0602 Shortness of breath: Secondary | ICD-10-CM

## 2017-12-28 DIAGNOSIS — IMO0001 Reserved for inherently not codable concepts without codable children: Secondary | ICD-10-CM

## 2017-12-28 DIAGNOSIS — A419 Sepsis, unspecified organism: Secondary | ICD-10-CM | POA: Diagnosis present

## 2017-12-28 DIAGNOSIS — Z6825 Body mass index (BMI) 25.0-25.9, adult: Secondary | ICD-10-CM

## 2017-12-28 DIAGNOSIS — N179 Acute kidney failure, unspecified: Secondary | ICD-10-CM | POA: Diagnosis present

## 2017-12-28 DIAGNOSIS — J690 Pneumonitis due to inhalation of food and vomit: Secondary | ICD-10-CM | POA: Diagnosis present

## 2017-12-28 DIAGNOSIS — R1310 Dysphagia, unspecified: Secondary | ICD-10-CM | POA: Diagnosis present

## 2017-12-28 DIAGNOSIS — I214 Non-ST elevation (NSTEMI) myocardial infarction: Secondary | ICD-10-CM | POA: Diagnosis present

## 2017-12-28 DIAGNOSIS — Z7982 Long term (current) use of aspirin: Secondary | ICD-10-CM

## 2017-12-28 DIAGNOSIS — R0902 Hypoxemia: Secondary | ICD-10-CM

## 2017-12-28 DIAGNOSIS — Z7902 Long term (current) use of antithrombotics/antiplatelets: Secondary | ICD-10-CM

## 2017-12-28 DIAGNOSIS — Z96649 Presence of unspecified artificial hip joint: Secondary | ICD-10-CM | POA: Diagnosis present

## 2017-12-28 DIAGNOSIS — I4819 Other persistent atrial fibrillation: Secondary | ICD-10-CM

## 2017-12-28 DIAGNOSIS — J9601 Acute respiratory failure with hypoxia: Secondary | ICD-10-CM | POA: Diagnosis present

## 2017-12-28 DIAGNOSIS — I447 Left bundle-branch block, unspecified: Secondary | ICD-10-CM | POA: Diagnosis present

## 2017-12-28 HISTORY — DX: Pneumonia, unspecified organism: J18.9

## 2017-12-28 HISTORY — DX: Non-ST elevation (NSTEMI) myocardial infarction: I21.4

## 2017-12-28 HISTORY — DX: Acute myocardial infarction, unspecified: I21.9

## 2017-12-28 HISTORY — DX: Unspecified urinary incontinence: R32

## 2017-12-28 HISTORY — DX: Personal history of urinary calculi: Z87.442

## 2017-12-28 LAB — I-STAT CHEM 8, ED
BUN: 22 mg/dL — ABNORMAL HIGH (ref 6–20)
CHLORIDE: 99 mmol/L — AB (ref 101–111)
CREATININE: 0.9 mg/dL (ref 0.44–1.00)
Calcium, Ion: 1.16 mmol/L (ref 1.15–1.40)
Glucose, Bld: 218 mg/dL — ABNORMAL HIGH (ref 65–99)
HEMATOCRIT: 34 % — AB (ref 36.0–46.0)
HEMOGLOBIN: 11.6 g/dL — AB (ref 12.0–15.0)
POTASSIUM: 4.4 mmol/L (ref 3.5–5.1)
Sodium: 131 mmol/L — ABNORMAL LOW (ref 135–145)
TCO2: 19 mmol/L — ABNORMAL LOW (ref 22–32)

## 2017-12-28 LAB — I-STAT TROPONIN, ED: TROPONIN I, POC: 1.36 ng/mL — AB (ref 0.00–0.08)

## 2017-12-28 LAB — CBC WITH DIFFERENTIAL/PLATELET
BASOS PCT: 0 %
Basophils Absolute: 0 10*3/uL (ref 0.0–0.1)
EOS ABS: 0 10*3/uL (ref 0.0–0.7)
EOS PCT: 0 %
HCT: 35.8 % — ABNORMAL LOW (ref 36.0–46.0)
Hemoglobin: 11.7 g/dL — ABNORMAL LOW (ref 12.0–15.0)
LYMPHS ABS: 0.5 10*3/uL — AB (ref 0.7–4.0)
Lymphocytes Relative: 4 %
MCH: 27.9 pg (ref 26.0–34.0)
MCHC: 32.7 g/dL (ref 30.0–36.0)
MCV: 85.4 fL (ref 78.0–100.0)
MONO ABS: 0.2 10*3/uL (ref 0.1–1.0)
MONOS PCT: 2 %
Neutro Abs: 10.3 10*3/uL — ABNORMAL HIGH (ref 1.7–7.7)
Neutrophils Relative %: 94 %
PLATELETS: 363 10*3/uL (ref 150–400)
RBC: 4.19 MIL/uL (ref 3.87–5.11)
RDW: 15 % (ref 11.5–15.5)
WBC: 11 10*3/uL — ABNORMAL HIGH (ref 4.0–10.5)

## 2017-12-28 LAB — COMPREHENSIVE METABOLIC PANEL
ALK PHOS: 80 U/L (ref 38–126)
ALT: 12 U/L — ABNORMAL LOW (ref 14–54)
ANION GAP: 18 — AB (ref 5–15)
AST: 44 U/L — ABNORMAL HIGH (ref 15–41)
Albumin: 3 g/dL — ABNORMAL LOW (ref 3.5–5.0)
BUN: 24 mg/dL — ABNORMAL HIGH (ref 6–20)
CALCIUM: 9.6 mg/dL (ref 8.9–10.3)
CO2: 18 mmol/L — AB (ref 22–32)
Chloride: 97 mmol/L — ABNORMAL LOW (ref 101–111)
Creatinine, Ser: 1.33 mg/dL — ABNORMAL HIGH (ref 0.44–1.00)
GFR calc non Af Amer: 33 mL/min — ABNORMAL LOW (ref >60)
GFR, EST AFRICAN AMERICAN: 38 mL/min — AB (ref >60)
Glucose, Bld: 230 mg/dL — ABNORMAL HIGH (ref 65–99)
POTASSIUM: 4.3 mmol/L (ref 3.5–5.1)
SODIUM: 133 mmol/L — AB (ref 135–145)
TOTAL PROTEIN: 6.1 g/dL — AB (ref 6.5–8.1)
Total Bilirubin: 0.7 mg/dL (ref 0.3–1.2)

## 2017-12-28 LAB — MRSA PCR SCREENING: MRSA by PCR: NEGATIVE

## 2017-12-28 LAB — I-STAT CG4 LACTIC ACID, ED
LACTIC ACID, VENOUS: 6.4 mmol/L — AB (ref 0.5–1.9)
LACTIC ACID, VENOUS: 4.91 mmol/L — AB (ref 0.5–1.9)

## 2017-12-28 LAB — BRAIN NATRIURETIC PEPTIDE: B Natriuretic Peptide: 1284.8 pg/mL — ABNORMAL HIGH (ref 0.0–100.0)

## 2017-12-28 LAB — LACTIC ACID, PLASMA: Lactic Acid, Venous: 2.8 mmol/L (ref 0.5–1.9)

## 2017-12-28 LAB — HEPARIN LEVEL (UNFRACTIONATED): Heparin Unfractionated: 0.38 IU/mL (ref 0.30–0.70)

## 2017-12-28 MED ORDER — ASPIRIN EC 81 MG PO TBEC
81.0000 mg | DELAYED_RELEASE_TABLET | Freq: Every day | ORAL | Status: DC
  Administered 2017-12-29 – 2017-12-31 (×3): 81 mg via ORAL
  Filled 2017-12-28 (×2): qty 1

## 2017-12-28 MED ORDER — SODIUM CHLORIDE 0.9 % IV BOLUS (SEPSIS): 250.0000 mL | Freq: Once | INTRAVENOUS | Status: DC

## 2017-12-28 MED ORDER — VANCOMYCIN HCL IN DEXTROSE 1-5 GM/200ML-% IV SOLN
1000.0000 mg | Freq: Once | INTRAVENOUS | Status: DC
  Filled 2017-12-28: qty 200

## 2017-12-28 MED ORDER — HEPARIN (PORCINE) IN NACL 100-0.45 UNIT/ML-% IJ SOLN
700.0000 [IU]/h | INTRAMUSCULAR | Status: DC
  Administered 2017-12-28 – 2017-12-29 (×2): 700 [IU]/h via INTRAVENOUS
  Filled 2017-12-28: qty 250

## 2017-12-28 MED ORDER — ONDANSETRON HCL 4 MG PO TABS: 4.0000 mg | ORAL_TABLET | Freq: Four times a day (QID) | ORAL | Status: DC | PRN

## 2017-12-28 MED ORDER — ROSUVASTATIN CALCIUM 5 MG PO TABS
5.0000 mg | ORAL_TABLET | Freq: Every day | ORAL | Status: DC
  Administered 2017-12-29 – 2017-12-30 (×2): 5 mg via ORAL
  Filled 2017-12-28 (×3): qty 1

## 2017-12-28 MED ORDER — TICAGRELOR 90 MG PO TABS
90.0000 mg | ORAL_TABLET | Freq: Two times a day (BID) | ORAL | Status: DC
  Administered 2017-12-28 – 2017-12-31 (×6): 90 mg via ORAL
  Filled 2017-12-28 (×6): qty 1

## 2017-12-28 MED ORDER — LISINOPRIL 10 MG PO TABS
10.0000 mg | ORAL_TABLET | Freq: Every day | ORAL | Status: DC
  Administered 2017-12-28 – 2017-12-29 (×2): 10 mg via ORAL
  Filled 2017-12-28 (×3): qty 1

## 2017-12-28 MED ORDER — DEXTROSE 5 % IV SOLN
2.0000 g | Freq: Once | INTRAVENOUS | Status: AC
  Administered 2017-12-28: 2 g via INTRAVENOUS
  Filled 2017-12-28: qty 2

## 2017-12-28 MED ORDER — SENNA 8.6 MG PO TABS
1.0000 | ORAL_TABLET | Freq: Every day | ORAL | Status: DC
  Administered 2017-12-29 – 2017-12-31 (×3): 8.6 mg via ORAL
  Filled 2017-12-28 (×4): qty 1

## 2017-12-28 MED ORDER — METRONIDAZOLE IN NACL 5-0.79 MG/ML-% IV SOLN
500.0000 mg | Freq: Three times a day (TID) | INTRAVENOUS | Status: DC
  Administered 2017-12-28 – 2017-12-29 (×3): 500 mg via INTRAVENOUS
  Filled 2017-12-28 (×3): qty 100

## 2017-12-28 MED ORDER — SODIUM CHLORIDE 0.9 % IV SOLN
INTRAVENOUS | Status: DC
  Administered 2017-12-28: 18:00:00 via INTRAVENOUS

## 2017-12-28 MED ORDER — HEPARIN BOLUS VIA INFUSION
3500.0000 [IU] | Freq: Once | INTRAVENOUS | Status: AC
  Administered 2017-12-28: 3500 [IU] via INTRAVENOUS
  Filled 2017-12-28: qty 3500

## 2017-12-28 MED ORDER — ONDANSETRON HCL 4 MG/2ML IJ SOLN
4.0000 mg | Freq: Four times a day (QID) | INTRAMUSCULAR | Status: DC | PRN
  Administered 2017-12-29: 4 mg via INTRAVENOUS
  Filled 2017-12-28: qty 2

## 2017-12-28 MED ORDER — OXYBUTYNIN CHLORIDE 5 MG PO TABS
5.0000 mg | ORAL_TABLET | Freq: Three times a day (TID) | ORAL | Status: DC
  Administered 2017-12-28 – 2017-12-31 (×8): 5 mg via ORAL
  Filled 2017-12-28 (×8): qty 1

## 2017-12-28 MED ORDER — HEPARIN BOLUS VIA INFUSION: 4000.0000 [IU] | Freq: Once | INTRAVENOUS | Status: DC

## 2017-12-28 MED ORDER — SODIUM CHLORIDE 0.9 % IV BOLUS (SEPSIS)
1000.0000 mL | Freq: Once | INTRAVENOUS | Status: AC
  Administered 2017-12-28: 1000 mL via INTRAVENOUS

## 2017-12-28 MED ORDER — METOPROLOL TARTRATE 5 MG/5ML IV SOLN
2.5000 mg | Freq: Once | INTRAVENOUS | Status: AC
  Administered 2017-12-28: 2.5 mg via INTRAVENOUS
  Filled 2017-12-28: qty 5

## 2017-12-28 MED ORDER — SODIUM CHLORIDE 0.9 % IV BOLUS (SEPSIS)
500.0000 mL | Freq: Once | INTRAVENOUS | Status: AC
  Administered 2017-12-28: 500 mL via INTRAVENOUS

## 2017-12-28 MED ORDER — HEPARIN (PORCINE) IN NACL 100-0.45 UNIT/ML-% IJ SOLN: 12.0000 [IU]/kg/h | INTRAMUSCULAR | Status: DC

## 2017-12-28 MED ORDER — ACETAMINOPHEN 500 MG PO TABS
500.0000 mg | ORAL_TABLET | Freq: Every day | ORAL | Status: DC
Start: 2017-12-28 — End: 2017-12-31
  Administered 2017-12-28 – 2017-12-30 (×3): 500 mg via ORAL
  Filled 2017-12-28 (×3): qty 1

## 2017-12-28 MED ORDER — DICLOFENAC SODIUM 1 % TD GEL: 2.0000 g | Freq: Three times a day (TID) | TRANSDERMAL | Status: DC | PRN

## 2017-12-28 MED ORDER — CEFEPIME HCL 1 G IJ SOLR
1.0000 g | INTRAMUSCULAR | Status: DC
  Administered 2017-12-29 – 2017-12-30 (×2): 1 g via INTRAVENOUS
  Filled 2017-12-28 (×3): qty 1

## 2017-12-28 MED ORDER — IPRATROPIUM-ALBUTEROL 0.5-2.5 (3) MG/3ML IN SOLN
3.0000 mL | Freq: Once | RESPIRATORY_TRACT | Status: AC
Start: 2017-12-28 — End: 2017-12-28
  Administered 2017-12-28: 3 mL via RESPIRATORY_TRACT
  Filled 2017-12-28: qty 3

## 2017-12-28 MED ORDER — VANCOMYCIN HCL 500 MG IV SOLR
500.0000 mg | INTRAVENOUS | Status: DC
  Filled 2017-12-28: qty 500

## 2017-12-28 MED ORDER — PANTOPRAZOLE SODIUM 40 MG PO TBEC
40.0000 mg | DELAYED_RELEASE_TABLET | Freq: Every day | ORAL | Status: DC
  Administered 2017-12-29 – 2017-12-31 (×3): 40 mg via ORAL
  Filled 2017-12-28 (×3): qty 1

## 2017-12-28 MED ORDER — NITROGLYCERIN 0.4 MG SL SUBL: 0.4000 mg | SUBLINGUAL_TABLET | SUBLINGUAL | Status: DC | PRN

## 2017-12-28 NOTE — ED Provider Notes (Signed)
[facility] EMERGENCY DEPARTMENT Provider Note   CSN: [id number] Arrival date & time: 12/28/17  1142     History   Chief Complaint Chief Complaint  Patient presents with  . Weakness    HPI [name] is a 84 y.o. female.  HPI  84 year old female, history of high blood pressure, hypertension, acid reflux and atrial fibrillation, also with a history of a myocardial infarction, she currently takes Brilinta, aspirin, and his DO NOT RESUSCITATE based on her wishes.  She currently lives at [facility] in [place], reports were that the patient awoke at approximately 2:30 in the morning and was having chest pain or shortness of breath.  Evidently she was wheezing and short of breath and was given a nebulized treatment with some improvement, she was also given nitroglycerin and aspirin.  When a granddaughter went to visit her this morning she found out what happened last night and asked that she be evaluated.  The patient states that she still feels a little short of breath but does not have any chest discomfort at this time.  She has had normal appetite, no dysuria or diarrhea, no headache or blurred vision.  The symptoms were improved with her albuterol treatment this morning however she is still short of breath.  Past Medical History:  Diagnosis Date  . Arthritis   . Atrial fibrillation (HCC)   . GERD (gastroesophageal reflux disease)   . Hiatal hernia   . HLD (hyperlipidemia)   . HTN (hypertension)   . S/P dilatation of esophageal stricture   . Thyroid disorder     Patient Active Problem List   Diagnosis Date Noted  . Chest pain 11/22/2017  . NSTEMI (non-ST elevated myocardial infarction) (HCC) 11/21/2017  . Fall 04/03/2015  . Acute blood loss anemia 04/03/2015  . Multiple rib fractures 04/01/2015  . GERD with stricture 11/23/2011  . HYPERLIPIDEMIA 03/07/2010  . Essential hypertension 03/07/2010    Past Surgical History:  Procedure Laterality  Date  . APPENDECTOMY    . CATARACT EXTRACTION    . CHOLECYSTECTOMY    . COLONOSCOPY  01/18/2007   tortuous or redundant colon  . TONSILLECTOMY    . TOTAL HIP ARTHROPLASTY    . UPPER GASTROINTESTINAL ENDOSCOPY  03/12/2010, 11/26/2011   esophageal stricture dilations, hiatal hernia    OB History    No data available       Home Medications    Prior to Admission medications   Medication Sig Start Date End Date Taking? Authorizing Provider  aspirin 81 MG EC tablet Take 1 tablet (81 mg total) by mouth daily. 11/23/17   Angelita Ingles, MD  atorvastatin (LIPITOR) 80 MG tablet Take 1 tablet (80 mg total) by mouth daily at 6 PM. 11/23/17   Angelita Ingles, MD  Cholecalciferol (VITAMIN D) 2000 units CAPS Take 2,000 Units by mouth 2 (two) times daily.     [provider]  co-enzyme Q-10 30 MG capsule Take 30 mg by mouth daily.      [provider]  diclofenac sodium (VOLTAREN) 1 % GEL Place 1 application onto the skin as needed for other. For Osteoarthritis in the knees 11/18/16   [provider]  guaiFENesin (MUCINEX) 600 MG 12 hr tablet Take 600 mg by mouth 2 (two) times daily.    [provider]  lisinopril (PRINIVIL,ZESTRIL) 10 MG tablet Take 10 mg by mouth daily.     [provider]  nitroGLYCERIN (NITROSTAT) 0.4 MG SL  tablet Place 0.4 mg under the tongue every 5 (five) minutes as needed for chest pain.    [provider]  oxybutynin (DITROPAN) 5 MG tablet Take 5 mg by mouth 3 (three) times daily. For bladder 01/24/15   [provider]  pantoprazole (PROTONIX) 40 MG tablet Take 1 tablet (40 mg total) by mouth daily. 11/24/17   Angelita Ingles, MD  SENNA PO Take 1 tablet by mouth daily as needed (constipation).    [provider]  ticagrelor (BRILINTA) 90 MG TABS tablet Take 1 tablet (90 mg total) by mouth 2 (two) times daily. 11/23/17   Angelita Ingles, MD    Family History Family History  Problem Relation  Age of Onset  . Diabetes Brother   . Heart disease Father   . Colon cancer Neg Hx   . Esophageal cancer Neg Hx   . Stomach cancer Neg Hx     Social History Social History   Tobacco Use  . Smoking status: Never Smoker  . Smokeless tobacco: Never Used  Substance Use Topics  . Alcohol use: No  . Drug use: No     Allergies   Amoxicillin   Review of Systems Review of Systems  All other systems reviewed and are negative.    Physical Exam Updated Vital Signs BP 133/80   Pulse (!) 102   Temp 97.6 F (36.4 C) (Axillary)   Resp (!) 22   Ht 5' (1.524 m)   Wt 59.8 kg (131 lb 13.4 oz)   SpO2 99%   BMI 25.75 kg/m   Physical Exam  Constitutional: She appears well-developed and well-nourished. No distress.  HENT:  Head: Normocephalic and atraumatic.  Mouth/Throat: Oropharynx is clear and moist. No oropharyngeal exudate.  Eyes: Conjunctivae and EOM are normal. Pupils are equal, round, and reactive to light. Right eye exhibits no discharge. Left eye exhibits no discharge. No scleral icterus.  Neck: Normal range of motion. Neck supple. No JVD present. No thyromegaly present.  Cardiovascular: Normal heart sounds and intact distal pulses. Exam reveals no gallop and no friction rub.  No murmur heard. Mild tachycardia, irregularly irregular rhythm  Pulmonary/Chest: She is in respiratory distress. She has no wheezes. She has rales.  Mild increased work of breathing with speaking in shortened sentences, rales mostly at the bases right greater than left, mild diffuse expiratory wheezing  Abdominal: Soft. Bowel sounds are normal. She exhibits no distension and no mass. There is no tenderness.  Musculoskeletal: Normal range of motion. She exhibits no edema or tenderness.  Lymphadenopathy:    She has no cervical adenopathy.  Neurological: She is alert. Coordination normal.  Skin: Skin is warm and dry. No rash noted. No erythema.  Psychiatric: She has a normal mood and affect. Her  behavior is normal.  Nursing note and vitals reviewed.    ED Treatments / Results  Labs (all labs ordered are listed, but only abnormal results are displayed) Labs Reviewed  CBC WITH DIFFERENTIAL/PLATELET - Abnormal; Notable for the following components:      Result Value   WBC 11.0 (*)    Hemoglobin 11.7 (*)    HCT 35.8 (*)    Neutro Abs 10.3 (*)    Lymphs Abs 0.5 (*)    All other components within normal limits  COMPREHENSIVE METABOLIC PANEL - Abnormal; Notable for the following components:   Sodium 133 (*)    Chloride 97 (*)    CO2 18 (*)    Glucose, Bld 230 (*)  BUN 24 (*)    Creatinine, Ser 1.33 (*)    Total Protein 6.1 (*)    Albumin 3.0 (*)    AST 44 (*)    ALT 12 (*)    GFR calc non Af Amer 33 (*)    GFR calc Af Amer 38 (*)    Anion gap 18 (*)    All other components within normal limits  I-STAT CG4 LACTIC ACID, ED - Abnormal; Notable for the following components:   Lactic Acid, Venous 6.40 (*)    All other components within normal limits  I-STAT TROPONIN, ED - Abnormal; Notable for the following components:   Troponin i, poc 1.36 (*)    All other components within normal limits  CULTURE, BLOOD (ROUTINE X 2)  CULTURE, BLOOD (ROUTINE X 2)  URINALYSIS, ROUTINE W REFLEX MICROSCOPIC  HEPARIN LEVEL (UNFRACTIONATED)  I-STAT CHEM 8, ED  I-STAT CG4 LACTIC ACID, ED    EKG  EKG Interpretation  Date/Time:  Tuesday December 28 2017 11:43:30 EST Ventricular Rate:  103 PR Interval:    QRS Duration: 157 QT Interval:  381 QTC Calculation: 499 R Axis:   5 Text Interpretation:  Sinus tachycardia Ventricular premature complex Left bundle branch block Confirmed by Eber Hong ([id number]) on 12/28/2017 12:02:02 PM       Radiology Dg Chest Port 1 View  Result Date: 12/28/2017 CLINICAL DATA:  Shortness of breath and chest pressure. EXAM: PORTABLE CHEST 1 VIEW COMPARISON:  Chest x-ray dated November 21, 2017. FINDINGS: Stable cardiomegaly. Normal pulmonary vascularity.  Slightly, chronically coarsened interstitial markings are similar to prior study. Increased left retrocardiac and right medial basilar density. Probable small left pleural effusion. No pneumothorax. Unchanged old right-sided rib fractures. No acute osseous abnormality. IMPRESSION: 1. Increasing density at both lung bases may reflect atelectasis or aspiration/pneumonia. 2. New small left pleural effusion. Electronically Signed   By: Obie Dredge M.D.   On: 12/28/2017 12:31    Procedures .Critical Care Performed by: Eber Hong, MD Authorized by: Eber Hong, MD   Critical care provider statement:    Critical care time (minutes):  35   Critical care time was exclusive of:  Separately billable procedures and treating other patients and teaching time   Critical care was necessary to treat or prevent imminent or life-threatening deterioration of the following conditions:  Cardiac failure   Critical care was time spent personally by me on the following activities:  Blood draw for specimens, development of treatment plan with patient or surrogate, discussions with consultants, evaluation of patient's response to treatment, examination of patient, obtaining history from patient or surrogate, ordering and performing treatments and interventions, ordering and review of laboratory studies, ordering and review of radiographic studies, pulse oximetry, re-evaluation of patient's condition and review of old charts   (including critical care time)  Medications Ordered in ED Medications  sodium chloride 0.9 % bolus 1,000 mL (not administered)    And  sodium chloride 0.9 % bolus 500 mL (not administered)    And  sodium chloride 0.9 % bolus 250 mL (not administered)  ceFEPIme (MAXIPIME) 2 g in dextrose 5 % 50 mL IVPB (2 g Intravenous New Bag/Given 12/28/17 1333)  vancomycin (VANCOCIN) IVPB 1000 mg/200 mL premix (not administered)  heparin ADULT infusion 100 units/mL (25000 units/291mL sodium chloride  0.45%) (700 Units/hr Intravenous New Bag/Given 12/28/17 1328)  ceFEPIme (MAXIPIME) 1 g in dextrose 5 % 50 mL IVPB (not administered)  vancomycin (VANCOCIN) 500 mg in sodium chloride 0.9 % 100  mL IVPB (not administered)  ipratropium-albuterol (DUONEB) 0.5-2.5 (3) MG/3ML nebulizer solution 3 mL (3 mLs Nebulization Given 12/28/17 1228)  heparin bolus via infusion 3,500 Units (3,500 Units Intravenous Bolus from Bag 12/28/17 1327)     Initial Impression / Assessment and Plan / ED Course  I have reviewed the triage vital signs and the nursing notes.  Pertinent labs & imaging results that were available during my care of the patient were reviewed by me and considered in my medical decision making (see chart for details).  Clinical Course as of Dec 28 1342  Tue Dec 28, 2017  1226 Lactic Acid, Venous: (!!) 6.40 [BM]  1226 Initial testing very abnormal with elevated troponin and lactic acid. Troponin i, poc: (!!) 1.36 [BM]  1310 WBC: (!) 11.0 [BM]  1310 Sodium: (!) 133 [BM]  1310 Creatinine: (!) 1.33 [BM]  1310 Glucose: (!) 230 [BM]  1310 Hyperglycemia and renal insufficiency.  multiple mild lab abnormalities including leukocytosis hyponatremia hypochloremia.  Cardiology aware of the patient and will come to see  Will admit to medicine service Albumin: (!) 3.0 [BM]  1325 View of the record shows that the patient had recently been admitted to the hospital with a non-ST elevation MI, had an echocardiogram showing some depressed ejection fraction, conservative therapy was maintained as the patient does not seem to be a good interventional candidate.  [BM]  1344 Discussed with the hospitalist at 1:45 PM, they will admit the patient to the hospital, she will require a high level of care, she is likely septic, she has had blood cultures, IV fluids at 30 cc/kg, she has not become hypotensive and thankfully at this point she is not febrile.  [BM]    Clinical Course User Index [BM] Eber Hong, MD     Patient's prehospital rhythm strips were evaluated, she has a wide-complex rhythm, it appears to be in atrial fibrillation with a rapid rate, her lung exam is significant for abnormal rales at the bases the, I would consider aspiration pneumonia, reactive airway disease, this could also be cardiac in nature.  The patient will likely need to be admitted to the hospital given her increased risk for cardiac disease as well as a persistent hypoxia to 91%, tachycardia and dyspnea.  X-ray pending.  Troponin elevated, chest x-ray with bibasilar infiltrates which could be consistent with pulmonary edema, would also consider infection.  Will need to discuss with cardiology.  The patient is critically ill with at least a non-ST elevation MI  There is certainly concern that her troponin may be elevated secondary to an infection especially given her elevated lactic acid though this is not specific for sepsis.  She will be treated with antibiotics, heparin, will consult with cardiology, paged at 12:35 p.m.  Final Clinical Impressions(s) / ED Diagnoses   Final diagnoses:  NSTEMI (non-ST elevated myocardial infarction) (HCC)  HCAP (healthcare-associated pneumonia)  Sepsis, due to unspecified organism [facility])      Eber Hong, MD 12/28/17 1345

## 2017-12-28 NOTE — ED Triage Notes (Signed)
Pt arrived via [facility] from [facility] in [place] c/o of weakness w/ preceding chest pressure beginning at 0230 today. Facility staff gave 1 SL nitro, pt went back to sleep until daybreak. Pt was noted to be short of breath with sats in the mid 80s, per EMS. They give prednisone and albuterol bringing Sp02 to 98%. Pt denies sob or pain at time of triage. Pt is alert and oriented with minor disorientation with time of day.

## 2017-12-28 NOTE — ED Notes (Signed)
Placed pt on 2L of O2, per Kathlene November - RN.

## 2017-12-28 NOTE — ED Notes (Signed)
Report attempted x 1

## 2017-12-28 NOTE — ED Notes (Signed)
Dr. Hyacinth Meeker not in his office lactic acid result given to RN Kathlene November

## 2017-12-28 NOTE — Progress Notes (Signed)
ANTICOAGULATION CONSULT NOTE - Initial Consult  Pharmacy Consult for heparin Indication: chest pain/ACS  Allergies  Allergen Reactions  . Amoxicillin Itching    Patient Measurements: Height: 5' (152.4 cm) Weight: 131 lb 13.4 oz (59.8 kg) IBW/kg (Calculated) : 45.5 Heparin Dosing Weight: 57.8kg  Vital Signs: Temp: 97.6 F (36.4 C) (01/22 1146) Temp Source: Axillary (01/22 1146) BP: 133/80 (01/22 1200) Pulse Rate: 102 (01/22 1200)  Labs: Recent Labs    12/28/17 1203  HGB 11.7*  HCT 35.8*  PLT 363  CREATININE 1.33*    Estimated Creatinine Clearance: 20.5 mL/min (A) (by C-G formula based on SCr of 1.33 mg/dL (H)).   Medical History: Past Medical History:  Diagnosis Date  . Arthritis   . Atrial fibrillation (HCC)   . GERD (gastroesophageal reflux disease)   . Hiatal hernia   . HLD (hyperlipidemia)   . HTN (hypertension)   . S/P dilatation of esophageal stricture   . Thyroid disorder     Medications:  Infusions:  . [START ON 12/29/2017] ceFEPime (MAXIPIME) IV    . ceFEPime (MAXIPIME) IV 2 g (12/28/17 1333)  . heparin 700 Units/hr (12/28/17 1328)  . sodium chloride     And  . sodium chloride     And  . sodium chloride    . [START ON 12/29/2017] vancomycin    . vancomycin      Assessment: 89 yof presented to the ED with weakness and CP. Troponin elevated and now starting IV heparin. Baseline H/H is slightly low but platelets are WNL. She not on anticoagulation despite history of afib as she was not deemed to be a good anticoagulation candidate.   Goal of Therapy:  Heparin level 0.3-0.7 units/ml Monitor platelets by anticoagulation protocol: Yes   Plan:  Heparin bolus 3500 units IV x 1 Heparin gtt 700 units/hr Check an 8 hr heparin level Daily heparin level and CBC  [name], Drake Leach 12/28/2017,1:37 PM

## 2017-12-28 NOTE — H&P (Addendum)
History and Physical:    [name]   ZOX:[id number] DOB: May 27, 1920 DOA: 12/28/2017  Referring MD/provider: Dr Hyacinth Meeker  PCP: Patient, No Pcp Per   Patient coming from: Home  Chief Complaint: Lethargy, cough, slightly increased confusion  History of Present Illness:   [name] is an 84 y.o. female with past medical history significant for paroxysmal atrial fibrillation, hypertension and recent NSTEMI  month ago who was brought in by her family from her assisted living facility with concerns of slight alteration of mental status and slight lethargy. History is per patient's son with some assistance from the patient herself. Patient apparently was in her usual state of health until about a week ago when she was noted to have a cough which was treated with antitussives. A couple of days ago she was noted to be slightly more confused than usual and to have slightly less energy. Yesterday she was noted to have a lack of appetite and appeared to be short of breath. This morning patient was frankly short of breath and admitted to it. Patient is brought in for evaluation. Per son, patient has not had a fever or chills. She does not appear to have been acutely ill. Patient herself admits that she is having some abdominal pain which she has had on and off for the past couple of days. Patient denies any chest pain. Patient intermittently states that she short of breath and then says that she's not short of breath. Patient denies having to sit up to breathe better.  ED Course:  The patient was noted to have an abnormal EKG with an elevated troponin to 1.4 as well as a lactate of 6.4. Sepsis protocol was started and patient was also started on heparin for acute coronary syndrome. Patient states that she feels somewhat better but doesn't really remember how she felt before.  ROS:   ROS   Unable to perform due to dementia  Past Medical History:   Past Medical History:  Diagnosis Date  .  Arthritis   . Atrial fibrillation (HCC)   . GERD (gastroesophageal reflux disease)   . Hiatal hernia   . HLD (hyperlipidemia)   . HTN (hypertension)   . S/P dilatation of esophageal stricture   . Thyroid disorder     Past Surgical History:   Past Surgical History:  Procedure Laterality Date  . APPENDECTOMY    . CATARACT EXTRACTION    . CHOLECYSTECTOMY    . COLONOSCOPY  01/18/2007   tortuous or redundant colon  . TONSILLECTOMY    . TOTAL HIP ARTHROPLASTY    . UPPER GASTROINTESTINAL ENDOSCOPY  03/12/2010, 11/26/2011   esophageal stricture dilations, hiatal hernia    Social History:   Social History   Socioeconomic History  . Marital status: Widowed    Spouse name: Not on file  . Number of children: 1  . Years of education: Not on file  . Highest education level: Not on file  Social Needs  . Financial resource strain: Not on file  . Food insecurity - worry: Not on file  . Food insecurity - inability: Not on file  . Transportation needs - medical: Not on file  . Transportation needs - non-medical: Not on file  Occupational History  . Occupation: Retired  Tobacco Use  . Smoking status: Never Smoker  . Smokeless tobacco: Never Used  Substance and Sexual Activity  . Alcohol use: No  . Drug use: No  . Sexual activity: Not on file  Other Topics Concern  . Not on file  Social History Narrative  . Not on file    Allergies   Amoxicillin  Family history:   Family History  Problem Relation Age of Onset  . Diabetes Brother   . Heart disease Father   . Colon cancer Neg Hx   . Esophageal cancer Neg Hx   . Stomach cancer Neg Hx     Current Medications:   Prior to Admission medications   Medication Sig Start Date End Date Taking? Authorizing Provider  aspirin 81 MG EC tablet Take 1 tablet (81 mg total) by mouth daily. 11/23/17   Angelita Ingles, MD  atorvastatin (LIPITOR) 80 MG tablet Take 1 tablet (80 mg total) by mouth daily at 6 PM. 11/23/17   Angelita Ingles, MD  Cholecalciferol (VITAMIN D) 2000 units CAPS Take 2,000 Units by mouth 2 (two) times daily.     [provider]  co-enzyme Q-10 30 MG capsule Take 30 mg by mouth daily.      [provider]  diclofenac sodium (VOLTAREN) 1 % GEL Place 1 application onto the skin as needed for other. For Osteoarthritis in the knees 11/18/16   [provider]  guaiFENesin (MUCINEX) 600 MG 12 hr tablet Take 600 mg by mouth 2 (two) times daily.    [provider]  lisinopril (PRINIVIL,ZESTRIL) 10 MG tablet Take 10 mg by mouth daily.     [provider]  nitroGLYCERIN (NITROSTAT) 0.4 MG SL tablet Place 0.4 mg under the tongue every 5 (five) minutes as needed for chest pain.    [provider]  oxybutynin (DITROPAN) 5 MG tablet Take 5 mg by mouth 3 (three) times daily. For bladder 01/24/15   [provider]  pantoprazole (PROTONIX) 40 MG tablet Take 1 tablet (40 mg total) by mouth daily. 11/24/17   Angelita Ingles, MD  SENNA PO Take 1 tablet by mouth daily as needed (constipation).    [provider]  ticagrelor (BRILINTA) 90 MG TABS tablet Take 1 tablet (90 mg total) by mouth 2 (two) times daily. 11/23/17   Angelita Ingles, MD    Physical Exam:   Vitals:   12/28/17 1230 12/28/17 1234 12/28/17 1300 12/28/17 1514  BP: 129/73  (!) 121/58 (!) 138/110  Pulse: 74  89 (!) 101  Resp: (!) 27  (!) 28 (!) 27  Temp:      TempSrc:      SpO2: 99% 99% 98% 97%  Weight:  59.8 kg (131 lb 13.4 oz)    Height:  5' (1.524 m)       Physical Exam: Blood pressure (!) 138/110, pulse (!) 101, temperature 97.6 F (36.4 C), temperature source Axillary, resp. rate (!) 27, height 5' (1.524 m), weight 59.8 kg (131 lb 13.4 oz), SpO2 97 %. Gen: Frail elderly patient lying flat in bed with shallow respirations, unlabored. Eyes: Sclerae anicteric. Conjunctiva mildly injected. Chest: Moderately good air entry bilaterally with distinct focal crackles at  the right base and mid lung field. There are no E to A changes noted. CV: Distant, irregular, no audible murmurs. Abdomen: NABS, soft, nondistended, nontender. No tenderness to light or deep palpation. No rebound, no guarding. Extremities: Upper pigmentation of chronic venous stasis changes, 1+ edema bilaterally. Skin: Warm and dry. Hyperpigmentation is noted above Neuro: Patient is alert although somewhat sleepy, easily arousable by voice alone. She is oriented to person and place. She is able to answer questions about the  present reliably. Grossly nonfocal. Psych: Patient is cooperative and appropriate  Data Review:    Labs: Basic Metabolic Panel: Recent Labs  Lab 12/28/17 1203  NA 133*  K 4.3  CL 97*  CO2 18*  GLUCOSE 230*  BUN 24*  CREATININE 1.33*  CALCIUM 9.6   Liver Function Tests: Recent Labs  Lab 12/28/17 1203  AST 44*  ALT 12*  ALKPHOS 80  BILITOT 0.7  PROT 6.1*  ALBUMIN 3.0*   No results for input(s): LIPASE, AMYLASE in the last 168 hours. No results for input(s): AMMONIA in the last 168 hours. CBC: Recent Labs  Lab 12/28/17 1203  WBC 11.0*  NEUTROABS 10.3*  HGB 11.7*  HCT 35.8*  MCV 85.4  PLT 363   Cardiac Enzymes: No results for input(s): CKTOTAL, CKMB, CKMBINDEX, TROPONINI in the last 168 hours.  BNP (last 3 results) No results for input(s): PROBNP in the last 8760 hours. CBG: No results for input(s): GLUCAP in the last 168 hours.  Urinalysis    Component Value Date/Time   COLORURINE YELLOW 04/16/2016 2316   APPEARANCEUR CLEAR 04/16/2016 2316   LABSPEC 1.007 04/16/2016 2316   PHURINE 7.0 04/16/2016 2316   GLUCOSEU NEGATIVE 04/16/2016 2316   HGBUR NEGATIVE 04/16/2016 2316   BILIRUBINUR NEGATIVE 04/16/2016 2316   KETONESUR NEGATIVE 04/16/2016 2316   PROTEINUR NEGATIVE 04/16/2016 2316   UROBILINOGEN 0.2 01/18/2010 0909   NITRITE NEGATIVE 04/16/2016 2316   LEUKOCYTESUR NEGATIVE 04/16/2016 2316      Radiographic Studies: Dg Chest  Port 1 View  Result Date: 12/28/2017 CLINICAL DATA:  Shortness of breath and chest pressure. EXAM: PORTABLE CHEST 1 VIEW COMPARISON:  Chest x-ray dated November 21, 2017. FINDINGS: Stable cardiomegaly. Normal pulmonary vascularity. Slightly, chronically coarsened interstitial markings are similar to prior study. Increased left retrocardiac and right medial basilar density. Probable small left pleural effusion. No pneumothorax. Unchanged old right-sided rib fractures. No acute osseous abnormality. IMPRESSION: 1. Increasing density at both lung bases may reflect atelectasis or aspiration/pneumonia. 2. New small left pleural effusion. Electronically Signed   By: Obie Dredge M.D.   On: 12/28/2017 12:31    EKG: LBBB w/o change   Assessment/Plan:   Active Problems:   Essential hypertension   GERD with stricture   NSTEMI (non-ST elevation myocardial infarction) (HCC)   Healthcare-associated pneumonia   Infectious systemic inflammatory response syndrome (SIRS) (HCC)   This is a frail elderly patient who comes in with sepsis likely secondary to HCAP versus aspiration pneumonia complicated by NSTEMI. Patient had previously been made DO NOT RESUSCITATE which I reconfirmed with her son at bedside. I noted that both sepsis and myocardial ischemia are very serious and there may be grave consequences. Patient's son said he was aware and was appreciative of the care she was getting.  NSTEMI Patient admitted for an STEMI last month at which time cardiac catheterization was not pursued to 4 advanced age and DO NOT RESUSCITATE status. Plan was for medical management although patient did not tolerate beta blocker at that time due to bradycardia. New ischemia today may well be secondary to demand given increased heart rate/RVR in the setting of sepsis. Will try to control heart rate with a beta blocker, metoprolol 2.5 IV has been given. If heart rate does not improve with treatment of infection and fluid  resuscitation, can consider starting metoprolol 12.5 by mouth twice a day She has been started on heparin, she got aspirin earlier today and she is maintained on her Brilinta. Cardiology has been  consulted although it's clear that no further workup is warranted at this time.  SEPSIS Possibly secondary to likely pneumonia. Will follow Sepsis protocol with aggressive fluid resuscitation and broad-spectrum antibiotics. Cefepime and vancomycin have been started, fluids have been adjusted for weight. Recent echo with normal systolic function last month. Lactate up to 6, implications discussed with son as noted above  HCAP VS ASPIRATION PNA  Indolent course suggestive of aspiration  I hear rales at right base without consolidation changes which is not necessarily seen on chest x-ray today. Repeat 2 view cxr ordered for the morning On cefepime and vancomycin per sepsis protocol Will add Flagyl to cover anaerobes, avoid vancomycin Zosyn combination due to chronic renal insufficiency. Will keep patient NPO pending swallow evaluation requested for tomorrow  HYPONATREMIA Subacute to chronic, dating back to hospitalization last month.  This can be worked up if warranted.  ARF Should improve with hydration and treatment of infection   ATRIAL FIBRILLATION  Control rate On heparin but not a long term coagulation candidate given age, risk of falls and on   GERD Continue PPI  Patient is DO NOT RESUSCITATE   Other information:   DVT prophylaxis: Lovenox ordered. Code Status: DO NOT RESUSCITATE. Family Communication: Concerned son at bedside  Disposition Plan: Back to her assisted living facility  Consults called: Cardiology  Admission status: Inpatient   The medical decision making on this patient was of high complexity and the patient is at high risk for clinical deterioration, therefore this is a level 3 visit.   Horatio Pel Orma Flaming [facility] Pager [phone] Cell: [phone]   If 7PM-7AM, please contact night-coverage [email or web address] Password [facility] 12/28/2017, 3:27 PM

## 2017-12-28 NOTE — Progress Notes (Signed)
ANTICOAGULATION CONSULT NOTE Pharmacy Consult for heparin Indication: chest pain/ACS  Allergies  Allergen Reactions  . Amoxicillin Itching    Patient Measurements: Height: 5' (152.4 cm) Weight: 131 lb 13.4 oz (59.8 kg) IBW/kg (Calculated) : 45.5 Heparin Dosing Weight: 57.8kg  Vital Signs: Temp: 97.4 F (36.3 C) (01/22 2014) Temp Source: Axillary (01/22 2014) BP: 119/91 (01/22 2014) Pulse Rate: 89 (01/22 2014)  Labs: Recent Labs    12/28/17 1203 12/28/17 1546 12/28/17 2113  HGB 11.7* 11.6*  --   HCT 35.8* 34.0*  --   PLT 363  --   --   HEPARINUNFRC  --   --  0.38  CREATININE 1.33* 0.90  --     Estimated Creatinine Clearance: 30.2 mL/min (by C-G formula based on SCr of 0.9 mg/dL).   Medical History: Past Medical History:  Diagnosis Date  . Arthritis    "mostly joints; pretty much all over" (12/28/2017)  . Atrial fibrillation (HCC)   . GERD (gastroesophageal reflux disease)   . Hiatal hernia   . History of kidney stones   . HLD (hyperlipidemia)   . HTN (hypertension)   . MI (myocardial infarction) (HCC) 11/21/2017  . NSTEMI (non-ST elevated myocardial infarction) (HCC) 12/28/2017  . Pneumonia 12/28/2017  . Thyroid disorder   . Urinary incontinence     Medications:  Infusions:  . sodium chloride 100 mL/hr at 12/28/17 1900  . [START ON 12/29/2017] ceFEPime (MAXIPIME) IV    . heparin 700 Units/hr (12/28/17 2100)  . metronidazole Stopped (12/28/17 2059)  . sodium chloride    . [START ON 12/29/2017] vancomycin    . vancomycin Stopped (12/28/17 1521)    Assessment: 89 yof presented to the ED with weakness and CP. Troponin elevated and now starting IV heparin. Baseline H/H is slightly low but platelets are WNL. She not on anticoagulation despite history of afib as she was not deemed to be a good anticoagulation candidate.   PM f/u> heparin level at goal.  No overt bleeding or complications noted.  Goal of Therapy:  Heparin level 0.3-0.7 units/ml Monitor  platelets by anticoagulation protocol: Yes   Plan:  Continue IV heparin at current rate. Daily heparin level and CBC  Tad Moore, BCPS  Clinical Pharmacist Pager [phone]  12/28/2017 9:52 PM

## 2017-12-28 NOTE — Consult Note (Addendum)
The patient has been seen in conjunction with Geoffry Paradise, NP-C. All aspects of care have been considered and discussed. The patient has been personally interviewed, examined, and all clinical data has been reviewed.   Presents with 48-72-hour history of progressive dyspnea.  Etiology includes aspiration pneumonia versus acute on chronic combined systolic and diastolic heart failure possibly on the basis of ischemic heart disease.  Patient is DNR.  No aggressive care is desired.  Blood pressure is relatively high, respiratory rate is 28, neck veins are elevated, rales are heard at the bases bilaterally.  Cardiac exam reveals a summation gallop and holosystolic murmur.  There is no peripheral edema.  EKG demonstrates left bundle, tachycardia, but no acute ST-T wave change.  The bundle is not new.  Metabolic acidosis is present, lactic acid is nearly 5, white count is mildly elevated, hemoglobin is 11.5.  Troponin is elevated and showing a downward trend from 6.4-4.9.  Overall, the patient is critically ill and has respiratory compromise with lactic acidosis.  Etiology is unclear.  Elevated cardiac markers are concerning.  There is a chance that this entire picture is related to heart failure although pulmonary infection/aspiration pneumonia cannot be excluded.  Recommend checking BNP (would give IV Lasix if significantly elevated), consider 2D Doppler echocardiogram to determine if LV function has deteriorated.  Overall prognosis is poor.  Discussed hospice and palliative care with the son.  The son was noncommittal, but I would recommend getting them involved.  Not a candidate for invasive or advanced heart failure therapy.    Cardiology Consult    Patient ID: [name] MRN: [id number], DOB/AGE: 06/30/1920   Admit date: 12/28/2017 Date of Consult: 12/28/2017  Primary Physician: Patient, No Pcp Per Primary Cardiologist: Garth Bigness) Requesting Provider: Hyacinth Meeker Reason for  Consultation: Chest pain  [name] is a 84 y.o. female who is being seen today for the evaluation of chest pain at the request of Dr. Hyacinth Meeker.   Patient Profile    84 yo female with PMH of NSTEMI (managed medically), PAF, HTN, HL and arthritis who presented with chest pain and shortness of breath.   Past Medical History   Past Medical History:  Diagnosis Date  . Arthritis   . Atrial fibrillation (HCC)   . GERD (gastroesophageal reflux disease)   . Hiatal hernia   . HLD (hyperlipidemia)   . HTN (hypertension)   . S/P dilatation of esophageal stricture   . Thyroid disorder     Past Surgical History:  Procedure Laterality Date  . APPENDECTOMY    . CATARACT EXTRACTION    . CHOLECYSTECTOMY    . COLONOSCOPY  01/18/2007   tortuous or redundant colon  . TONSILLECTOMY    . TOTAL HIP ARTHROPLASTY    . UPPER GASTROINTESTINAL ENDOSCOPY  03/12/2010, 11/26/2011   esophageal stricture dilations, hiatal hernia     Allergies  Allergies  Allergen Reactions  . Amoxicillin Itching    History of Present Illness    [name] is a 84 yo female with PMH of NSTEMI (managed medically), PAF, HTN, HL and arthritis. She has been followed by Dr. Alonza Bogus with [facility] for her PAF. Has not been on anticoagulation given her hx of falls. Per family reports she was recently admitted back in 11/21/17 and was seen by cardiology, though I can not find any notes to confirm this. Notes indicate that she was admitted through [facility] medicine with an NSTEMI with a peak troponin of 24. There are  notes of discussion with cardiology and she was started on DAPT with ASA/Brilinta, but I do not find any notes from [facility]. She returned to the assisted living facility on 11/23/17.   Family reports she had been in her usual state of health until last evening. When they visited her yesterday evening they noticed her breathing was more labored. This morning she reported chest pain that woke her up from sleep.  Staff noted that she was slightly more short of breath. When her granddaughter came to check on her, she noticed she was worse than the day prior. Asked that she be transferred to [facility].   In the ED her labs showed Na+ 133, Cr 1.33, Trop 1.36, Lactic Acid 6.40, Hgb 11.7. CXR was concerning for aspiration PNA with small left pleural effusion. EKG showed SR with known LBBB. She was started on IV heparin and antibiotics in the ED. No chest pain at the time of assessment.   Inpatient Medications    . acetaminophen  500 mg Oral QHS  . aspirin  81 mg Oral Daily  . lisinopril  10 mg Oral Daily  . oxybutynin  5 mg Oral TID  . pantoprazole  40 mg Oral Daily  . rosuvastatin  5 mg Oral Daily  . senna  1 tablet Oral Daily  . ticagrelor  90 mg Oral BID    Family History    Family History  Problem Relation Age of Onset  . Diabetes Brother   . Heart disease Father   . Colon cancer Neg Hx   . Esophageal cancer Neg Hx   . Stomach cancer Neg Hx     Social History    Social History   Socioeconomic History  . Marital status: Widowed    Spouse name: Not on file  . Number of children: 1  . Years of education: Not on file  . Highest education level: Not on file  Social Needs  . Financial resource strain: Not on file  . Food insecurity - worry: Not on file  . Food insecurity - inability: Not on file  . Transportation needs - medical: Not on file  . Transportation needs - non-medical: Not on file  Occupational History  . Occupation: Retired  Tobacco Use  . Smoking status: Never Smoker  . Smokeless tobacco: Never Used  Substance and Sexual Activity  . Alcohol use: No  . Drug use: No  . Sexual activity: Not on file  Other Topics Concern  . Not on file  Social History Narrative  . Not on file     Review of Systems    See HPI  All other systems reviewed and are otherwise negative except as noted above.  Physical Exam    Blood pressure (!) 138/110, pulse (!) 101, temperature 97.6  F (36.4 C), temperature source Axillary, resp. rate (!) 27, height 5' (1.524 m), weight 131 lb 13.4 oz (59.8 kg), SpO2 97 %.  General: Pleasant, frail older WF, NAD Psych: Normal affect. Neuro: Alert and oriented to self and surroundings. Moves all extremities spontaneously. HEENT: Normal  Neck: Supple, + JVD. Lungs:  Resp regular and unlabored, diminished bilaterally L>R. Heart: RRR no s3, s4, soft systolic murmurs. Abdomen: Soft, non-tender, non-distended, BS + x 4.  Extremities: No clubbing, cyanosis or edema. Chronic venous changes. DP/PT/Radials 2+ and equal bilaterally.  Labs    Troponin [facility] of Care Test) Recent Labs    12/28/17 1215  TROPIPOC 1.36*   No results for input(s): CKTOTAL,  CKMB, TROPONINI in the last 72 hours. Lab Results  Component Value Date   WBC 11.0 (H) 12/28/2017   HGB 11.6 (L) 12/28/2017   HCT 34.0 (L) 12/28/2017   MCV 85.4 12/28/2017   PLT 363 12/28/2017    Recent Labs  Lab 12/28/17 1203 12/28/17 1546  NA 133* 131*  K 4.3 4.4  CL 97* 99*  CO2 18*  --   BUN 24* 22*  CREATININE 1.33* 0.90  CALCIUM 9.6  --   PROT 6.1*  --   BILITOT 0.7  --   ALKPHOS 80  --   ALT 12*  --   AST 44*  --   GLUCOSE 230* 218*   No results found for: CHOL, HDL, LDLCALC, TRIG No results found for: [facility]   Radiology Studies    Dg Chest Port 1 View  Result Date: 12/28/2017 CLINICAL DATA:  Shortness of breath and chest pressure. EXAM: PORTABLE CHEST 1 VIEW COMPARISON:  Chest x-ray dated November 21, 2017. FINDINGS: Stable cardiomegaly. Normal pulmonary vascularity. Slightly, chronically coarsened interstitial markings are similar to prior study. Increased left retrocardiac and right medial basilar density. Probable small left pleural effusion. No pneumothorax. Unchanged old right-sided rib fractures. No acute osseous abnormality. IMPRESSION: 1. Increasing density at both lung bases may reflect atelectasis or aspiration/pneumonia. 2. New small left pleural  effusion. Electronically Signed   By: Obie Dredge M.D.   On: 12/28/2017 12:31    ECG & Cardiac Imaging    EKG: SR with known LBBB  Echo: 11/22/17  Study Conclusions  - Left ventricle: The cavity size was normal. Wall thickness was   increased in a pattern of mild LVH. Systolic function was normal.   The estimated ejection fraction was in the range of 50% to 55%.   Wall motion was normal; there were no regional wall motion   abnormalities. Doppler parameters are consistent with abnormal   left ventricular relaxation (grade 1 diastolic dysfunction). - Ventricular septum: Septal motion showed abnormal function and   dyssynergy. - Aortic valve: Mildly thickened, mildly calcified leaflets. There   was mild regurgitation. - Mitral valve: Calcified annulus. Mildly thickened, mildly   calcified leaflets . There was mild regurgitation. - Left atrium: The atrium was mildly dilated. - Pulmonary arteries: Systolic pressure was mildly increased. PA   peak pressure: 32 mm Hg (S).  Assessment & Plan    84 yo female with PMH of NSTEMI (managed medically), PAF, HTN, HL and arthritis who presented with chest pain and shortness of breath.   1. Elevated Troponin with chest pain: Pt was admitted back in 12/18 with NSTEMI with peak trop of 24. Has been on ASA/Brilinta since then. Had chest pain and shortness of breath this morning. EKG shows known LBBB. She has been started on IV heparin. Given her advanced age, she is not a candidate for invasive therapies. Would continue to treat medically at this time.  -- cycle troponins -- IV heparin for now -- on ASA/Brilinta (this was supposedly discussed with cardiology last admission, but I can not find notes to confirm). Not sure that she is truly benefiting from this therapy with increase risk of bleeding.  -- she is tachycardic from infection, consider adding low dose BB once respiratory status improves given suspicion for coronary disease.  2.  Sepsis: CXR concerning for aspiration PNA. Lactic acid 6 on admission. Plan per primary team  3. HTN: stable  4. HL: was placed on high dose statin last admission, but then reduced  back to Crestor 5mg  daily by PCP at follow up given advanced age. It's reasonable to continue the same  5. PAF: SR on admission. No OAC given hx of falls.   Janice Coffin, NP-C Pager [phone] 12/28/2017, 4:09 PM

## 2017-12-28 NOTE — Progress Notes (Signed)
Pharmacy Antibiotic Note  [name] is a 84 y.o. female admitted on 12/28/2017 with pneumonia.  Pharmacy has been consulted for vancomycin and cefepime dosing. Pt is afebrile and WBC is mildly elevated. Lactic acid is significantly elevated and Scr is slightly elevated.   Plan: Vancomycin 1gm IV x 1 then 500mg  IV Q24H Cefepime 2gm IV x 1 then 1gm IV Q24H F/u renal fxn, C&S, clinical status and trough at SS  Height: 5' (152.4 cm) Weight: 131 lb 13.4 oz (59.8 kg) IBW/kg (Calculated) : 45.5  Temp (24hrs), Avg:97.6 F (36.4 C), Min:97.6 F (36.4 C), Max:97.6 F (36.4 C)  Recent Labs  Lab 12/28/17 1203 12/28/17 1217  WBC 11.0*  --   CREATININE 1.33*  --   LATICACIDVEN  --  6.40*    Estimated Creatinine Clearance: 20.5 mL/min (A) (by C-G formula based on SCr of 1.33 mg/dL (H)).    Allergies  Allergen Reactions  . Amoxicillin Itching    Antimicrobials this admission: Vanc 1/22>> Cefepime 1/22>>  Dose adjustments this admission: N/A  Microbiology results: Pending  Thank you for allowing pharmacy to be a part of this patient's care.  Morgin Halls, Drake Leach 12/28/2017 1:35 PM

## 2017-12-29 ENCOUNTER — Inpatient Hospital Stay (HOSPITAL_COMMUNITY): Payer: Medicare Other

## 2017-12-29 DIAGNOSIS — J690 Pneumonitis due to inhalation of food and vomit: Secondary | ICD-10-CM

## 2017-12-29 DIAGNOSIS — J9601 Acute respiratory failure with hypoxia: Secondary | ICD-10-CM

## 2017-12-29 DIAGNOSIS — I480 Paroxysmal atrial fibrillation: Secondary | ICD-10-CM

## 2017-12-29 DIAGNOSIS — Z7189 Other specified counseling: Secondary | ICD-10-CM

## 2017-12-29 DIAGNOSIS — R1310 Dysphagia, unspecified: Secondary | ICD-10-CM

## 2017-12-29 DIAGNOSIS — I361 Nonrheumatic tricuspid (valve) insufficiency: Secondary | ICD-10-CM

## 2017-12-29 DIAGNOSIS — I5033 Acute on chronic diastolic (congestive) heart failure: Secondary | ICD-10-CM

## 2017-12-29 DIAGNOSIS — J189 Pneumonia, unspecified organism: Secondary | ICD-10-CM

## 2017-12-29 DIAGNOSIS — Z515 Encounter for palliative care: Secondary | ICD-10-CM

## 2017-12-29 LAB — CBC
HCT: 36.3 % (ref 36.0–46.0)
Hemoglobin: 12 g/dL (ref 12.0–15.0)
MCH: 28 pg (ref 26.0–34.0)
MCHC: 33.1 g/dL (ref 30.0–36.0)
MCV: 84.8 fL (ref 78.0–100.0)
PLATELETS: 410 10*3/uL — AB (ref 150–400)
RBC: 4.28 MIL/uL (ref 3.87–5.11)
RDW: 15 % (ref 11.5–15.5)
WBC: 14.3 10*3/uL — ABNORMAL HIGH (ref 4.0–10.5)

## 2017-12-29 LAB — COMPREHENSIVE METABOLIC PANEL
ALBUMIN: 2.9 g/dL — AB (ref 3.5–5.0)
ALT: 14 U/L (ref 14–54)
ANION GAP: 13 (ref 5–15)
AST: 39 U/L (ref 15–41)
Alkaline Phosphatase: 82 U/L (ref 38–126)
BUN: 25 mg/dL — ABNORMAL HIGH (ref 6–20)
CALCIUM: 8.8 mg/dL — AB (ref 8.9–10.3)
CO2: 17 mmol/L — AB (ref 22–32)
Chloride: 100 mmol/L — ABNORMAL LOW (ref 101–111)
Creatinine, Ser: 1.08 mg/dL — ABNORMAL HIGH (ref 0.44–1.00)
GFR calc non Af Amer: 42 mL/min — ABNORMAL LOW (ref >60)
GFR, EST AFRICAN AMERICAN: 49 mL/min — AB (ref >60)
Glucose, Bld: 204 mg/dL — ABNORMAL HIGH (ref 65–99)
POTASSIUM: 4.5 mmol/L (ref 3.5–5.1)
SODIUM: 130 mmol/L — AB (ref 135–145)
TOTAL PROTEIN: 6 g/dL — AB (ref 6.5–8.1)
Total Bilirubin: 0.8 mg/dL (ref 0.3–1.2)

## 2017-12-29 LAB — ECHOCARDIOGRAM COMPLETE
HEIGHTINCHES: 60 in
WEIGHTICAEL: 2109.36 [oz_av]

## 2017-12-29 LAB — HEMOGLOBIN A1C
HEMOGLOBIN A1C: 6.6 % — AB (ref 4.8–5.6)
Mean Plasma Glucose: 142.72 mg/dL

## 2017-12-29 LAB — TSH: TSH: 1.225 u[IU]/mL (ref 0.350–4.500)

## 2017-12-29 LAB — HEPARIN LEVEL (UNFRACTIONATED): HEPARIN UNFRACTIONATED: 0.36 [IU]/mL (ref 0.30–0.70)

## 2017-12-29 LAB — TROPONIN I
Troponin I: 4.26 ng/mL (ref <0.03)
Troponin I: 3.79 ng/mL (ref <0.03)

## 2017-12-29 LAB — GLUCOSE, CAPILLARY: Glucose-Capillary: 155 mg/dL — ABNORMAL HIGH (ref 65–99)

## 2017-12-29 MED ORDER — METOPROLOL TARTRATE 5 MG/5ML IV SOLN
INTRAVENOUS | Status: AC
  Filled 2017-12-29: qty 5

## 2017-12-29 MED ORDER — METOPROLOL TARTRATE 5 MG/5ML IV SOLN
5.0000 mg | Freq: Once | INTRAVENOUS | Status: AC
  Administered 2017-12-29: 5 mg via INTRAVENOUS

## 2017-12-29 MED ORDER — FUROSEMIDE 10 MG/ML IJ SOLN
40.0000 mg | Freq: Once | INTRAMUSCULAR | Status: AC
  Administered 2017-12-29: 40 mg via INTRAVENOUS
  Filled 2017-12-29: qty 4

## 2017-12-29 MED ORDER — WHITE PETROLATUM EX OINT
TOPICAL_OINTMENT | CUTANEOUS | Status: AC
  Administered 2017-12-29: 03:00:00
  Filled 2017-12-29: qty 28.35

## 2017-12-29 MED ORDER — METOPROLOL TARTRATE 12.5 MG HALF TABLET
12.5000 mg | ORAL_TABLET | Freq: Two times a day (BID) | ORAL | Status: DC
  Administered 2017-12-29 – 2017-12-31 (×4): 12.5 mg via ORAL
  Filled 2017-12-29 (×5): qty 1

## 2017-12-29 MED ORDER — INSULIN ASPART 100 UNIT/ML ~~LOC~~ SOLN
0.0000 [IU] | Freq: Three times a day (TID) | SUBCUTANEOUS | Status: DC
  Administered 2017-12-29 – 2017-12-30 (×2): 2 [IU] via SUBCUTANEOUS
  Administered 2017-12-30: 7 [IU] via SUBCUTANEOUS
  Administered 2017-12-30: 2 [IU] via SUBCUTANEOUS

## 2017-12-29 MED ORDER — FUROSEMIDE 10 MG/ML IJ SOLN: 40.0000 mg | Freq: Every day | INTRAMUSCULAR | Status: DC

## 2017-12-29 MED ORDER — ISOSORBIDE MONONITRATE ER 30 MG PO TB24
30.0000 mg | ORAL_TABLET | Freq: Every day | ORAL | Status: DC
  Administered 2017-12-29 – 2017-12-31 (×3): 30 mg via ORAL
  Filled 2017-12-29 (×3): qty 1

## 2017-12-29 MED ORDER — LISINOPRIL 5 MG PO TABS: 5.0000 mg | ORAL_TABLET | Freq: Every day | ORAL | Status: DC

## 2017-12-29 NOTE — Progress Notes (Signed)
ANTICOAGULATION CONSULT NOTE Pharmacy Consult for heparin Indication: chest pain/ACS  Allergies  Allergen Reactions  . Amoxicillin Itching    Patient Measurements: Height: 5' (152.4 cm) Weight: 131 lb 13.4 oz (59.8 kg) IBW/kg (Calculated) : 45.5 Heparin Dosing Weight: 57.8kg  Vital Signs: Temp: 98.8 F (37.1 C) (01/23 0754) Temp Source: Oral (01/23 0754) BP: 115/69 (01/23 0754) Pulse Rate: 141 (01/23 0754)  Labs: Recent Labs    12/28/17 1203 12/28/17 1546 12/28/17 2113 12/29/17 0338  HGB 11.7* 11.6*  --  12.0  HCT 35.8* 34.0*  --  36.3  PLT 363  --   --  410*  HEPARINUNFRC  --   --  0.38 0.36  CREATININE 1.33* 0.90  --  1.08*    Estimated Creatinine Clearance: 25.2 mL/min (A) (by C-G formula based on SCr of 1.08 mg/dL (H)).   Medical History: Past Medical History:  Diagnosis Date  . Arthritis    "mostly joints; pretty much all over" (12/28/2017)  . Atrial fibrillation (HCC)   . GERD (gastroesophageal reflux disease)   . Hiatal hernia   . History of kidney stones   . HLD (hyperlipidemia)   . HTN (hypertension)   . MI (myocardial infarction) (HCC) 11/21/2017  . NSTEMI (non-ST elevated myocardial infarction) (HCC) 12/28/2017  . Pneumonia 12/28/2017  . Thyroid disorder   . Urinary incontinence     Medications:  Infusions:  . sodium chloride 10 mL/hr at 12/29/17 0200  . ceFEPime (MAXIPIME) IV    . heparin 700 Units/hr (12/29/17 0700)  . metronidazole Stopped (12/29/17 0401)  . sodium chloride    . vancomycin    . vancomycin Stopped (12/28/17 1521)    Assessment: 89 yof presented to the ED with weakness and CP. Troponin elevated and on starting IV heparin. She not on anticoagulation despite history of afib as she was not deemed to be a good anticoagulation candidate. She is noted with a recent NSTEMI in 11/2017. No plans noted for invasive interventions -heparin level at goal  Goal of Therapy:  Heparin level 0.3-0.7 units/ml Monitor platelets by  anticoagulation protocol: Yes   Plan:  Continue IV heparin at current rate. Daily heparin level and CBC Will follow plans for length of therapy  Harland German, Pharm D 12/29/2017 10:12 AM

## 2017-12-29 NOTE — Progress Notes (Signed)
@  approx. 0030 paged K. Schorr regarding critical Lactic Acid ofand elevated Troponin. IV Lasix ordered and administered. Lungs presently course but clear.  @ approx-0200 HR increased to 120-140s Afib RVR confirmed by 12 Lead EKG  @0211  paged K. Schorr of [facility] regarding change in HR. Page returned at 0215 and told to call cardiology on-call to manage Afib RVR.  @0219  Paged Dr. Clarnce Flock of Cardiology regarding pt's Afib-RVR, earlier elevated BNP, and overall condition. IV Metoprolol and IVF @KVO  ordered. Both administered. Pt rate lowered 80s-110s, but remains in A-fib with increased O2 demaind. Lungs still coarse but clear.  @approx  0245 pt converted back to NSR 80s-110s. O2 demand decreased to 2L [place]. Pt work of breathing still increased. Primary updated will convey to day team and continue to monitor.
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
## 2017-12-29 NOTE — Progress Notes (Signed)
  Echocardiogram 2D Echocardiogram has been performed.  Haden Cavenaugh T Dossie Swor 12/29/2017, 4:01 PM

## 2017-12-29 NOTE — Progress Notes (Signed)
PROGRESS NOTE   [name]  ZOX:[id number]    DOB: 10/11/1920    DOA: 12/28/2017  PCP: Lavell Islam, MD   I have briefly reviewed patients previous medical records in [facility].  Brief Narrative:  84 year old female, ALF resident, PMH of recent NSTEMI/medically managed, PAF not on anticoagulation due to history of falls (followed by Dr. Alonza Bogus with [facility]), HTN, HLD, GERD, presented to ED with chest pain, 1-2 days progressive dyspnea and recent cough.  Admitted for sepsis due to HCAP Vs aspiration pneumonia, decompensated CHF, lactic acidosis and elevated troponin.  Cardiology and palliative care consulted.   Assessment & Plan:   Active Problems:   Essential hypertension   GERD with stricture   NSTEMI (non-ST elevation myocardial infarction) (HCC)   Healthcare-associated pneumonia   Infectious systemic inflammatory response syndrome (SIRS) (HCC)   1. Sepsis due to possible pneumonia: Received IV fluids per sepsis protocol in ED but IV fluids then discontinued due to concern for decompensated CHF.  Empirically started on IV vancomycin, cefepime and Flagyl.  Blood cultures x2- to date, MRSA PCR negative.  Discussed with pharmacy 1/23 and discontinued vancomycin.  Continue cefepime for now. 2. Suspected aspiration pneumonia versus HCAP: Continue IV cefepime and can transition to oral Augmentin in a day or 2.  Speech therapy for swallow evaluation.  Discontinue vancomycin and Flagyl. 3. Dysphagia: Discussed with speech therapy and started dysphagia 1 diet and thin liquids.  Further evaluation as needed. 4. Acute on chronic diastolic CHF: Received a dose of IV Lasix 40 mg x1 overnight.  +1.1 L since admission.  TTE 12/17: LVEF 50-55% and grade 1 diastolic dysfunction.  Chest x-ray shows persistent pulmonary edema.  Repeat Lasix 40 mg IV x1.  Repeat 2D echo.  Cardiology input appreciated and indicate overall poor prognosis and recommend palliative care consultation-called.  BNP  1284.8. 5. Elevated troponin/demand ischemia versus NSTEMI: Cardiology input appreciated.  Currently on IV heparin drip, aspirin and Brillinta.  Repeat 2D echo.  Await cardiology follow-up.  Troponin I 0.36, not trended, will repeat. 6. Lactic acidosis: Secondary to sepsis and decompensated CHF.  Improved, 6.4 > 2.8 7. Essential HTN: Controlled.  Continue lisinopril 8. Hyperlipidemia: Continue Crestor 9. PAF: Currently in sinus rhythm.  Not anticoagulation candidate secondary to falls. 10. Acute respiratory failure with hypoxia: Secondary to pneumonia and decompensated CHF.  Wean oxygen as tolerated.  Treat as above. 11. Acute kidney injury: Improved. 12. GERD: Continue PPI. 13. Hyponatremia: Stable.   14. Adult failure to thrive: Multifactorial related to very advanced age, frail physical status and multiple severe significant medical comorbidities.  Palliative care plans to meet this afternoon. 15. Hyperglycemia, R/O DM: Check A1c.  SSI.   DVT prophylaxis: Currently on IV heparin drip Code Status: DNR Family Communication: None at bedside Disposition: To be determined   Consultants:  Cardiology Palliative care team  Procedures:  None  Antimicrobials:  IV cefepime IV vancomycin and Flagyl discontinued   Subjective: Patient reports improvement in breathing, close to but not yet at baseline.  Cough with occasional yellow sputum.  Denies chest pain or fevers.   ROS: As above  Objective:  Vitals:   12/29/17 0430 12/29/17 0705 12/29/17 0754 12/29/17 1243  BP: (!) 129/103 (!) 96/53 115/69 107/63  Pulse: 80 (!) 102 (!) 141 65  Resp: (!) 29 (!) 26 20 (!) 25  Temp: (!) 96.3 F (35.7 C)  98.8 F (37.1 C)   TempSrc: Axillary  Oral   SpO2: 96%  93% 92% 96%  Weight:      Height:        Examination:  General exam: Elderly female, small built and frail, lying propped up in bed with mild tachypnea. Respiratory system: Bronchial breath sounds in the bases.  Few scattered  bilateral rhonchi.  Occasional basal crackles.  Mild increased work of breathing. Cardiovascular system: S1 & S2 heard, RRR. No JVD, murmurs, rubs, gallops or clicks. No pedal edema.  Telemetry: Sinus rhythm with BBB morphology.  Had transient atrial fibrillation with RVR in the 130s at 2 AM on telemetry. Gastrointestinal system: Abdomen is nondistended, soft and nontender. No organomegaly or masses felt. Normal bowel sounds heard. Central nervous system: Alert and oriented x2. No focal neurological deficits. Extremities: Symmetric 5 x 5 power. Skin: Chronic hyperpigmentation of bilateral legs.  Also multiple superficial bruising over lower extremities. Psychiatry: Judgement and insight impaired. Mood & affect appropriate.     Data Reviewed: I have personally reviewed following labs and imaging studies  CBC: Recent Labs  Lab 12/28/17 1203 12/28/17 1546 12/29/17 0338  WBC 11.0*  --  14.3*  NEUTROABS 10.3*  --   --   HGB 11.7* 11.6* 12.0  HCT 35.8* 34.0* 36.3  MCV 85.4  --  84.8  PLT 363  --  410*   Basic Metabolic Panel: Recent Labs  Lab 12/28/17 1203 12/28/17 1546 12/29/17 0338  NA 133* 131* 130*  K 4.3 4.4 4.5  CL 97* 99* 100*  CO2 18*  --  17*  GLUCOSE 230* 218* 204*  BUN 24* 22* 25*  CREATININE 1.33* 0.90 1.08*  CALCIUM 9.6  --  8.8*   Liver Function Tests: Recent Labs  Lab 12/28/17 1203 12/29/17 0338  AST 44* 39  ALT 12* 14  ALKPHOS 80 82  BILITOT 0.7 0.8  PROT 6.1* 6.0*  ALBUMIN 3.0* 2.9*   Coagulation Profile: No results for input(s): INR, PROTIME in the last 168 hours. Cardiac Enzymes: No results for input(s): CKTOTAL, CKMB, CKMBINDEX, TROPONINI in the last 168 hours. HbA1C: No results for input(s): HGBA1C in the last 72 hours. CBG: No results for input(s): GLUCAP in the last 168 hours.  Recent Results (from the past 240 hour(s))  Blood Culture (routine x 2)     Status: None (Preliminary result)   Collection Time: 12/28/17 12:45 PM  Result Value  Ref Range Status   Specimen Description BLOOD RIGHT ANTECUBITAL  Final   Special Requests   Final    BOTTLES DRAWN AEROBIC AND ANAEROBIC Blood Culture adequate volume   Culture NO GROWTH < 24 HOURS  Final   Report Status PENDING  Incomplete  Blood Culture (routine x 2)     Status: None (Preliminary result)   Collection Time: 12/28/17 12:55 PM  Result Value Ref Range Status   Specimen Description BLOOD RIGHT HAND  Final   Special Requests   Final    BOTTLES DRAWN AEROBIC AND ANAEROBIC Blood Culture adequate volume   Culture NO GROWTH < 24 HOURS  Final   Report Status PENDING  Incomplete  MRSA PCR Screening     Status: None   Collection Time: 12/28/17  7:04 PM  Result Value Ref Range Status   MRSA by PCR NEGATIVE NEGATIVE Final    Comment:        The GeneXpert MRSA Assay (FDA approved for NASAL specimens only), is one component of a comprehensive MRSA colonization surveillance program. It is not intended to diagnose MRSA infection nor to guide or monitor  treatment for MRSA infections.          Radiology Studies: X-ray Chest Pa And Lateral  Result Date: 12/29/2017 CLINICAL DATA:  Episodes of shortness of breath and wheezing today. No chest pain. History of previous MI. EXAM: CHEST  2 VIEW COMPARISON:  Chest x-ray of December 28, 2017 FINDINGS: There is interval increase in the density of the pulmonary interstitium consistent with interstitial edema. The cardiac silhouette is enlarged and its margins indistinct. The pulmonary vascularity is engorged and indistinct. There calcification in the wall of the aortic arch. There is pleural fluid layering posteriorly and inferiorly. There is multilevel degenerative disc disease of the thoracic spine. IMPRESSION: CHF with pulmonary interstitial edema. Moderate sized bilateral pleural effusions layering posteriorly. One cannot exclude basilar pneumonia due to overlying pleural fluid. Electronically Signed   By: David  [place] M.D.   On:  12/29/2017 09:08   Dg Chest Port 1 View  Result Date: 12/28/2017 CLINICAL DATA:  Shortness of breath and chest pressure. EXAM: PORTABLE CHEST 1 VIEW COMPARISON:  Chest x-ray dated November 21, 2017. FINDINGS: Stable cardiomegaly. Normal pulmonary vascularity. Slightly, chronically coarsened interstitial markings are similar to prior study. Increased left retrocardiac and right medial basilar density. Probable small left pleural effusion. No pneumothorax. Unchanged old right-sided rib fractures. No acute osseous abnormality. IMPRESSION: 1. Increasing density at both lung bases may reflect atelectasis or aspiration/pneumonia. 2. New small left pleural effusion. Electronically Signed   By: Obie Dredge M.D.   On: 12/28/2017 12:31        Scheduled Meds: . acetaminophen  500 mg Oral QHS  . aspirin EC  81 mg Oral Daily  . lisinopril  10 mg Oral Daily  . oxybutynin  5 mg Oral TID  . pantoprazole  40 mg Oral Daily  . rosuvastatin  5 mg Oral Daily  . senna  1 tablet Oral Daily  . ticagrelor  90 mg Oral BID   Continuous Infusions: . sodium chloride 10 mL/hr at 12/29/17 0200  . ceFEPime (MAXIPIME) IV    . heparin 700 Units/hr (12/29/17 0700)  . metronidazole 500 mg (12/29/17 1042)  . sodium chloride       LOS: 1 day     Marcellus Scott, MD, FACP, [facility]. [facility] Pager [phone]  If 7PM-7AM, please contact night-coverage [email or web address] Password [facility] 12/29/2017, 2:03 PM

## 2017-12-29 NOTE — Consult Note (Signed)
Consultation Note Date: 12/29/2017   Patient Name: [name]  DOB: 25-Aug-1920  MRN: [id number]  Age / Sex: 84 y.o., female  PCP: Cloward, Dianna Rossetti, MD Referring Physician: Modena Jansky, MD  Reason for Consultation: Establishing goals of care  HPI/Patient Profile: 84 y.o. female  with past medical history of Afib, MI (December 2018), arthritis, thyroid disease, and total hip replacement who was admitted on 12/28/2017 with lethargy and sepsis secondary to HCAP vs acute heart failure exacerbation or a combination of the two.  At the time of admission [name]'s pulse rate was 141, lactic acid was 6.4, BNP was 1284 and troponin was elevated.   Cardiology consulted and noted that the patient is not a candidate for invasive or advanced heart failure therapy.   Cardiology discussed hospice and palliative care the the son.  Clinical Assessment and Goals of Care:  I have reviewed medical records including EPIC notes, labs and imaging, received report from the care team, assessed the patient and then met at the bedside along with her son to discuss diagnosis prognosis, GOC, EOL wishes, disposition and options.  I introduced Palliative Medicine as specialized medical care for people living with serious illness. It focuses on providing relief from the symptoms and stress of a serious illness. The goal is to improve quality of life for both the patient and the family.  We discussed a brief life review of the patient.  She was born in [place] and has lived in the same house her entire life.  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  She is devoted to her church and has many faithful friends. She stopped driving at age 39.  Approximately 4-5 months ago she moved into [facility] and is very happy there.  She would like to return to [facility] from the hospital.  As far as functional and nutritional status prior to  this hospitalization she had recovered well from her MI in December and was able to walk with a walker.  We discussed their current illness and what it means in the larger context of their on-going co-morbidities.  Natural disease trajectory and expectations at EOL were discussed.  Specifically we discussed diastolic heart failure and recurrent aspiration pneumonia.  For medical decisions the patient defers to her son [name].  She also has two nieces near by that help with her care.  Hospice and Palliative Care services outpatient were explained and offered.  The family is considering Hospice services at ALF.  Questions and concerns were addressed. The family was encouraged to call with questions or concerns.    Primary Decision Maker:  PATIENT and her son [name]    SUMMARY OF RECOMMENDATIONS    Return to [facility] with Hospice Support when she is medically optimized.    Family is considering Hospice - they have not finalized their decision yet.  Code Status/Advance Care Planning:  DNR  Psycho-social/Spiritual:   Desire for further Chaplaincy support: Yes  Prognosis:  Less than 6 months given significant  MI in 11/2017, recurrent hospitalization with D-HF, and HCAP at 84 years old.   Discharge Planning: Recommending ALF with Hospice      Primary Diagnoses: Present on Admission: . NSTEMI (non-ST elevation myocardial infarction) ([place]) . Essential hypertension   I have reviewed the medical record, interviewed the patient and family, and examined the patient. The following aspects are pertinent.  Past Medical History:  Diagnosis Date  . Arthritis    "mostly joints; pretty much all over" (12/28/2017)  . Atrial fibrillation ([place])   . GERD (gastroesophageal reflux disease)   . Hiatal hernia   . History of kidney stones   . HLD (hyperlipidemia)   . HTN (hypertension)   . MI (myocardial infarction) ([place]) 11/21/2017  . NSTEMI (non-ST elevated myocardial infarction)  ([place]) 12/28/2017  . Pneumonia 12/28/2017  . Thyroid disorder   . Urinary incontinence    Social History   Socioeconomic History  . Marital status: Widowed    Spouse name: None  . Number of children: 1  . Years of education: None  . Highest education level: None  Social Needs  . Financial resource strain: None  . Food insecurity - worry: None  . Food insecurity - inability: None  . Transportation needs - medical: None  . Transportation needs - non-medical: None  Occupational History  . Occupation: Retired  Tobacco Use  . Smoking status: Never Smoker  . Smokeless tobacco: Never Used  Substance and Sexual Activity  . Alcohol use: No  . Drug use: No  . Sexual activity: None  Other Topics Concern  . None  Social History Narrative  . None   Family History  Problem Relation Age of Onset  . Diabetes Brother   . Heart disease Father   . Colon cancer Neg Hx   . Esophageal cancer Neg Hx   . Stomach cancer Neg Hx    Scheduled Meds: . acetaminophen  500 mg Oral QHS  . aspirin EC  81 mg Oral Daily  . lisinopril  10 mg Oral Daily  . oxybutynin  5 mg Oral TID  . pantoprazole  40 mg Oral Daily  . rosuvastatin  5 mg Oral Daily  . senna  1 tablet Oral Daily  . ticagrelor  90 mg Oral BID   Continuous Infusions: . sodium chloride 10 mL/hr at 12/29/17 0200  . ceFEPime (MAXIPIME) IV    . heparin 700 Units/hr (12/29/17 0700)  . metronidazole 500 mg (12/29/17 1042)  . sodium chloride    . vancomycin    . vancomycin Stopped (12/28/17 1521)   PRN Meds:.diclofenac sodium, nitroGLYCERIN, ondansetron **OR** ondansetron (ZOFRAN) IV Allergies  Allergen Reactions  . Amoxicillin Itching   Review of Systems Reports that she is having difficulty remembering and speaking certain words.  No complaints of pain or distress.  Physical Exam  Thin, frail, elderly female, very pleasant in good spirits.  Son at bedside. CV tachycardic Resp decreased breath sounds, stridor in the  throat. Abdomen soft, nt, nd  Vital Signs: BP 115/69 (BP Location: Right Arm)   Pulse (!) 141   Temp 98.8 F (37.1 C) (Oral)   Resp 20   Ht 5' (1.524 m)   Wt 59.8 kg (131 lb 13.4 oz)   SpO2 92%   BMI 25.75 kg/m  Pain Assessment: No/denies pain   Pain Score: 0-No pain   SpO2: SpO2: 92 % O2 Device:SpO2: 92 % O2 Flow Rate: .O2 Flow Rate (L/min): 2 L/min  IO: Intake/output summary:   Intake/Output  Summary (Last 24 hours) at 12/29/2017 1107 Last data filed at 12/29/2017 0700 Gross per 24 hour  Intake 1872.73 ml  Output 700 ml  Net 1172.73 ml    LBM:   Baseline Weight: Weight: 59.8 kg (131 lb 13.4 oz) Most recent weight: Weight: 59.8 kg (131 lb 13.4 oz)     Palliative Assessment/Data: 40%     Time In: 1:00 Time Out: 2:20 Time Total: 80 min. Greater than 50%  of this time was spent counseling and coordinating care related to the above assessment and plan.  Signed by: Florentina Jenny, PA-C Palliative Medicine Pager: [phone]  Please contact Palliative Medicine Team phone at [phone] for questions and concerns.  For individual provider: See Shea Evans

## 2017-12-29 NOTE — Progress Notes (Signed)
No charge note  PMT meeting scheduled for 12:30 TODAY.  Norvel Richards, PA-C Palliative Medicine Pager: [phone]

## 2017-12-29 NOTE — Evaluation (Signed)
Physical Therapy Evaluation Patient Details Name: [name] MRN: [id number] DOB: 1920/02/07 Today's Date: 12/29/2017   History of Present Illness  Pt adm with sepsis due to PNA as well as acute on chronic heart failiure. PMH - HTN, NSTEMI, chf, THR, afib, arthritis  Clinical Impression  Pt admitted with above diagnosis and presents to PT with functional limitations due to deficits listed below (See PT problem list). Pt needs skilled PT to maximize independence and safety to allow discharge to back to ALF with initial HHPT to assist her return back to baseline. Expect pt will make steady return to baseline if medical status improves.     Follow Up Recommendations Home health PT(at her ALF)    Equipment Recommendations  None recommended by PT    Recommendations for Other Services       Precautions / Restrictions Precautions Precautions: Fall Restrictions Weight Bearing Restrictions: No      Mobility  Bed Mobility Overal bed mobility: Needs Assistance Bed Mobility: Sit to Supine       Sit to supine: Min assist   General bed mobility comments: assist to bring legs back up into bed  Transfers Overall transfer level: Needs assistance Equipment used: 4-wheeled walker Transfers: Sit to/from Stand Sit to Stand: Min assist         General transfer comment: Assist for balance  Ambulation/Gait Ambulation/Gait assistance: Min assist Ambulation Distance (Feet): 120 Feet Assistive device: 4-wheeled walker Gait Pattern/deviations: Step-through pattern;Decreased stride length;Trunk flexed Gait velocity: decr   General Gait Details: Assist for balance and safety. Verbal cues to stand more erect. Amb on RA with SpO2 90-94%  Stairs            Wheelchair Mobility    Modified Rankin (Stroke Patients Only)       Balance Overall balance assessment: Needs assistance Sitting-balance support: No upper extremity supported;Feet supported Sitting balance-Leahy Scale:  Fair     Standing balance support: Bilateral upper extremity supported Standing balance-Leahy Scale: Poor Standing balance comment: rollator and min guard assist for static standing                             Pertinent Vitals/Pain Pain Assessment: No/denies pain    Home Living Family/patient expects to be discharged to:: Assisted living               Home Equipment: Walker - 4 wheels Additional Comments: [facility]    Prior Function Level of Independence: Needs assistance   Gait / Transfers Assistance Needed: Amb with rollator to dining room           Hand Dominance        Extremity/Trunk Assessment   Upper Extremity Assessment Upper Extremity Assessment: Generalized weakness    Lower Extremity Assessment Lower Extremity Assessment: Generalized weakness    Cervical / Trunk Assessment Cervical / Trunk Assessment: Kyphotic  Communication   Communication: HOH  Cognition Arousal/Alertness: Awake/alert Behavior During Therapy: WFL for tasks assessed/performed Overall Cognitive Status: Within Functional Limits for tasks assessed Area of Impairment: Memory                     Memory: Decreased short-term memory                General Comments      Exercises     Assessment/Plan    PT Assessment Patient needs continued PT services  PT Problem List Decreased strength;Decreased activity  tolerance;Decreased balance;Decreased mobility       PT Treatment Interventions DME instruction;Gait training;Functional mobility training;Therapeutic activities;Therapeutic exercise;Balance training;Patient/family education    PT Goals (Current goals can be found in the Care Plan section)  Acute Rehab PT Goals Patient Stated Goal: return to [facility] PT Goal Formulation: With patient/family Time For Goal Achievement: 01/05/18 Potential to Achieve Goals: Good    Frequency Min 3X/week   Barriers to discharge        Co-evaluation                AM-PAC PT "6 Clicks" Daily Activity  Outcome Measure Difficulty turning over in bed (including adjusting bedclothes, sheets and blankets)?: A Little Difficulty moving from lying on back to sitting on the side of the bed? : Unable Difficulty sitting down on and standing up from a chair with arms (e.g., wheelchair, bedside commode, etc,.)?: Unable Help needed moving to and from a bed to chair (including a wheelchair)?: A Little Help needed walking in hospital room?: A Little Help needed climbing 3-5 steps with a railing? : A Lot 6 Click Score: 13    End of Session Equipment Utilized During Treatment: Gait belt Activity Tolerance: Patient tolerated treatment well Patient left: in bed;with call bell/phone within reach;with bed alarm set;with family/visitor present Nurse Communication: Mobility status PT Visit Diagnosis: Unsteadiness on feet (R26.81);Muscle weakness (generalized) (M62.81)    Time: 6979-4801 PT Time Calculation (min) (ACUTE ONLY): 37 min   Charges:   PT Evaluation $PT Eval Moderate Complexity: 1 Mod PT Treatments $Gait Training: 8-22 mins   PT G Codes:        [facility] PT [phone]   Angelina Ok [facility] 12/29/2017, 5:18 PM

## 2017-12-29 NOTE — Progress Notes (Addendum)
CRITICAL VALUE ALERT  Critical Value:  troponin  Date & Time Notied:  12/29/2017 1816  Provider Notified: Dr. Waymon Amato and Micah Flesher

## 2017-12-29 NOTE — Clinical Social Work Note (Addendum)
Clinical Social Work Assessment  Patient Details  Name: [name] MRN: [id number] Date of Birth: 1920/02/08  Date of referral:  12/29/17               Reason for consult:  Discharge Planning                Permission sought to share information with:  Family Supports Permission granted to share information::  Yes, Verbal Permission Granted  Name::     Hailey Bigner  Agency::  [facility]  Relationship::  son  Contact Information:  [phone]  Housing/Transportation Living arrangements for the past 2 months:  Assisted Living Facility Source of Information:  Adult Children Patient Interpreter Needed:  None Criminal Activity/Legal Involvement Pertinent to Current Situation/Hospitalization:  No - Comment as needed Significant Relationships:  Adult Children Lives with:  Facility Resident Do you feel safe going back to the place where you live?  Yes Need for family participation in patient care:  Yes (Comment)  Care giving concerns:  Son at bedside.   Social Worker assessment / plan:  CSW spoke with patient son Earvin Hansen in regards to patients disposition plan. Earvin Hansen stated that patient is from [facility] and has been at facility since august of 2018. Earvin Hansen stated he would like patient to return back to facility. Earvin Hansen stated he is still thinking if he would like hospice to follow at facility, he stated he will need to talk to his family to make sure they are in agreement. CSW to follow up with facility to make them aware of patient being at [facility].    Employment status:  Retired Health and safety inspector:  Medicare PT Recommendations:  Not assessed at this time Information / Referral to community resources:  Other (Comment Required)(return to facility with hospice)  Patient/Family's Response to care:  Earvin Hansen verbalized appreciation for the care patient has received at [facility]  Patient/Family's Understanding of and Emotional Response to Diagnosis, Current Treatment, and  Prognosis:  Earvin Hansen verbalized understanding of patients recent hospitalization and current limitations.  Emotional Assessment Appearance:  Appears stated age Attitude/Demeanor/Rapport:    Affect (typically observed):  Unable to Assess Orientation:  Oriented to Place, Oriented to Self Alcohol / Substance use:  Not Applicable Psych involvement (Current and /or in the community):  No (Comment)  Discharge Needs  Concerns to be addressed:  No discharge needs identified Readmission within the last 30 days:  No Current discharge risk:  None Barriers to Discharge:  No Barriers Identified   Althea Charon, LCSW 12/29/2017, 3:20 PM

## 2017-12-29 NOTE — Progress Notes (Signed)
Progress Note  Patient Name: [name] Date of Encounter: 12/29/2017  Primary Cardiologist: No primary care provider on file.   Subjective   Feeling poorly but unable to explain why.  She denies chest pain or shortness of breath.   Inpatient Medications    Scheduled Meds: . acetaminophen  500 mg Oral QHS  . aspirin EC  81 mg Oral Daily  . furosemide  40 mg Intravenous Once  . insulin aspart  0-9 Units Subcutaneous TID WC  . lisinopril  10 mg Oral Daily  . oxybutynin  5 mg Oral TID  . pantoprazole  40 mg Oral Daily  . rosuvastatin  5 mg Oral Daily  . senna  1 tablet Oral Daily  . ticagrelor  90 mg Oral BID   Continuous Infusions: . ceFEPime (MAXIPIME) IV 1 g (12/29/17 1407)  . heparin 700 Units/hr (12/29/17 0700)   PRN Meds: diclofenac sodium, nitroGLYCERIN, ondansetron **OR** ondansetron (ZOFRAN) IV   Vital Signs    Vitals:   12/29/17 0430 12/29/17 0705 12/29/17 0754 12/29/17 1243  BP: (!) 129/103 (!) 96/53 115/69 107/63  Pulse: 80 (!) 102 (!) 141 65  Resp: (!) 29 (!) 26 20 (!) 25  Temp: (!) 96.3 F (35.7 C)  98.8 F (37.1 C)   TempSrc: Axillary  Oral   SpO2: 96% 93% 92% 96%  Weight:      Height:        Intake/Output Summary (Last 24 hours) at 12/29/2017 1518 Last data filed at 12/29/2017 0700 Gross per 24 hour  Intake 1322.73 ml  Output 700 ml  Net 622.73 ml   Filed Weights   12/28/17 1234  Weight: 131 lb 13.4 oz (59.8 kg)    Telemetry    Atrial fibrillation in the 110s alternating with sinus rhythm in the 70s.  - Personally Reviewed  ECG    Sinus tachycardia.  Rate 103 bpm.  LBBB.  PVC.  - Personally Reviewed  Physical Exam   VS:  BP 107/63 (BP Location: Right Arm)   Pulse 65   Temp 98.8 F (37.1 C) (Oral)   Resp (!) 25   Ht 5' (1.524 m)   Wt 131 lb 13.4 oz (59.8 kg)   SpO2 96%   BMI 25.75 kg/m  , BMI Body mass index is 25.75 kg/m. GENERAL:  Well appearing elderly woman lying comfortably in bed.  No acute distress.  HEENT:  Pupils equal round and reactive, fundi not visualized, oral mucosa unremarkable NECK:  No jugular venous distention, waveform within normal limits, carotid upstroke brisk and symmetric, no bruits, no thyromegaly LUNGS:  Clear to auscultation bilaterally on anterior exam.  HEART:  RRR.  PMI not displaced or sustained,S1 and S2 within normal limits, no S3, no S4, no clicks, no rubs, no murmurs ABD:  Flat, positive bowel sounds normal in frequency in pitch, no bruits, no rebound, no guarding, no midline pulsatile mass, no hepatomegaly, no splenomegaly EXT:  2 plus pulses throughout, no edema, no cyanosis no clubbing SKIN:  No rashes no nodules NEURO:  Cranial nerves II through XII grossly intact, motor grossly intact throughout [facility]:  Cognitively intact, oriented to person place and time   Labs    Chemistry Recent Labs  Lab 12/28/17 1203 12/28/17 1546 12/29/17 0338  NA 133* 131* 130*  K 4.3 4.4 4.5  CL 97* 99* 100*  CO2 18*  --  17*  GLUCOSE 230* 218* 204*  BUN 24* 22* 25*  CREATININE 1.33* 0.90 1.08*  CALCIUM 9.6  --  8.8*  PROT 6.1*  --  6.0*  ALBUMIN 3.0*  --  2.9*  AST 44*  --  39  ALT 12*  --  14  ALKPHOS 80  --  82  BILITOT 0.7  --  0.8  GFRNONAA 33*  --  42*  GFRAA 38*  --  49*  ANIONGAP 18*  --  13     Hematology Recent Labs  Lab 12/28/17 1203 12/28/17 1546 12/29/17 0338  WBC 11.0*  --  14.3*  RBC 4.19  --  4.28  HGB 11.7* 11.6* 12.0  HCT 35.8* 34.0* 36.3  MCV 85.4  --  84.8  MCH 27.9  --  28.0  MCHC 32.7  --  33.1  RDW 15.0  --  15.0  PLT 363  --  410*    Cardiac EnzymesNo results for input(s): TROPONINI in the last 168 hours.  Recent Labs  Lab 12/28/17 1215  TROPIPOC 1.36*     BNP Recent Labs  Lab 12/28/17 2249  BNP 1,284.8*     DDimer No results for input(s): DDIMER in the last 168 hours.   Radiology    X-ray Chest Pa And Lateral  Result Date: 12/29/2017 CLINICAL DATA:  Episodes of shortness of breath and wheezing today. No chest  pain. History of previous MI. EXAM: CHEST  2 VIEW COMPARISON:  Chest x-ray of December 28, 2017 FINDINGS: There is interval increase in the density of the pulmonary interstitium consistent with interstitial edema. The cardiac silhouette is enlarged and its margins indistinct. The pulmonary vascularity is engorged and indistinct. There calcification in the wall of the aortic arch. There is pleural fluid layering posteriorly and inferiorly. There is multilevel degenerative disc disease of the thoracic spine. IMPRESSION: CHF with pulmonary interstitial edema. Moderate sized bilateral pleural effusions layering posteriorly. One cannot exclude basilar pneumonia due to overlying pleural fluid. Electronically Signed   By: David  [place] M.D.   On: 12/29/2017 09:08   Dg Chest Port 1 View  Result Date: 12/28/2017 CLINICAL DATA:  Shortness of breath and chest pressure. EXAM: PORTABLE CHEST 1 VIEW COMPARISON:  Chest x-ray dated November 21, 2017. FINDINGS: Stable cardiomegaly. Normal pulmonary vascularity. Slightly, chronically coarsened interstitial markings are similar to prior study. Increased left retrocardiac and right medial basilar density. Probable small left pleural effusion. No pneumothorax. Unchanged old right-sided rib fractures. No acute osseous abnormality. IMPRESSION: 1. Increasing density at both lung bases may reflect atelectasis or aspiration/pneumonia. 2. New small left pleural effusion. Electronically Signed   By: Obie Dredge M.D.   On: 12/28/2017 12:31    Cardiac Studies   Echo currently being performed.    Patient Profile     84 y.o. female with paroxysmal atrial fibrillation, hypertension, hyperlipidemia and medically managed NSTEMI 11/2017 here with chest pain, shortness of breath and elevated troponin.   Assessment & Plan    # NSTEMI: Troponin was elevated to 1.36.  EKG shows LBBB that was previously noted.  She is not a candidate for intervention.  We will start metoprolol 12.5mg   bid and Imdur 30mg  daily.  Reduce lisinopril to 5mg  to allow room for these medications.  Continue aspirin and ticagrelor.  Continue heparin with plans to run this for 48 hours.  Repeat troponin and echo pending.  # Paroxysmal atrial fibrillation:  She has been in and out of atrial fibrillation this admission.  Ms. Brinkmeier is not anticoagulated due to a fall history.  She has been on dual antiplatelet  therapy.  Currently on heparin.  Start metoprolol as above.  Her rates in atrial fibrillation are not well-controlled.    # Hypertension: Decrease lisinopril as above to make room for metoprolol.  # Dispo: Patient and family are considering hospice.  We will continue to work to optimize her medications for comfort and quality.   For questions or updates, please contact [facility] Please consult [email or web address] for contact info under Cardiology/STEMI.      Signed, Chilton Si, MD  12/29/2017, 3:18 PM

## 2017-12-29 NOTE — Care Management Note (Signed)
Case Management Note  Patient Details  Name: [name] MRN: [id number] Date of Birth: 03/31/1920  Subjective/Objective:    NSTEMI, afib, HTN                Action/Plan: Discharge Planning: Spoke to pt and son, Earvin Hansen at bedside. Pt is from [facility]. They do have SNF available. Pt will be seen by Palliative and son understand. States she is not at her baseline. She was walking with RW to meals and she has been lethargic here at hospital. SLP worked with pt and she is on a puree diet. Son states she ate her roast beef and pears. She has been eating much the past few days at ALF. Contacted CSW with referral back to [facility]. Has RW and wheelchair. She uses oxygen intermittently at [facility] but son does not believe it is a set up for oxygen for her at facility. Pt may need oxygen at dc. Will need qualifying sats. Made Unit RN aware. Will check on dc day, pt is improving daily. Will arrange oxygen at dc. Spoke to [facility] and they only have a standing O2 at 2L prn. They use [facility] for oxygen. [facility] will arrange Hospice.  PCP CLOWARD, Laban Emperor MD   Expected Discharge Date:                Expected Discharge Plan:  Assisted Living / Rest Home  In-House Referral:  Clinical Social Work  Discharge planning Services  CM Consult  Post Acute Care Choice:  NA Choice offered to:  NA  DME Arranged:  N/A DME Agency:  NA  HH Arranged:  NA HH Agency:  NA  Status of Service:  In process, will continue to follow  If discussed at Long Length of Stay Meetings, dates discussed:    Additional Comments:  Elliot Cousin, RN 12/29/2017, 3:38 PM

## 2017-12-30 ENCOUNTER — Inpatient Hospital Stay (HOSPITAL_COMMUNITY): Payer: Medicare Other

## 2017-12-30 DIAGNOSIS — N179 Acute kidney failure, unspecified: Secondary | ICD-10-CM

## 2017-12-30 DIAGNOSIS — I4819 Other persistent atrial fibrillation: Secondary | ICD-10-CM

## 2017-12-30 DIAGNOSIS — J189 Pneumonia, unspecified organism: Secondary | ICD-10-CM

## 2017-12-30 DIAGNOSIS — I481 Persistent atrial fibrillation: Secondary | ICD-10-CM

## 2017-12-30 DIAGNOSIS — I5041 Acute combined systolic (congestive) and diastolic (congestive) heart failure: Secondary | ICD-10-CM

## 2017-12-30 DIAGNOSIS — N189 Chronic kidney disease, unspecified: Secondary | ICD-10-CM

## 2017-12-30 LAB — BASIC METABOLIC PANEL
ANION GAP: 11 (ref 5–15)
BUN: 42 mg/dL — ABNORMAL HIGH (ref 6–20)
CHLORIDE: 100 mmol/L — AB (ref 101–111)
CO2: 19 mmol/L — AB (ref 22–32)
Calcium: 8.7 mg/dL — ABNORMAL LOW (ref 8.9–10.3)
Creatinine, Ser: 1.68 mg/dL — ABNORMAL HIGH (ref 0.44–1.00)
GFR calc Af Amer: 29 mL/min — ABNORMAL LOW (ref >60)
GFR calc non Af Amer: 25 mL/min — ABNORMAL LOW (ref >60)
GLUCOSE: 155 mg/dL — AB (ref 65–99)
Potassium: 4.2 mmol/L (ref 3.5–5.1)
Sodium: 130 mmol/L — ABNORMAL LOW (ref 135–145)

## 2017-12-30 LAB — CBC
HEMATOCRIT: 31.9 % — AB (ref 36.0–46.0)
HEMOGLOBIN: 10.3 g/dL — AB (ref 12.0–15.0)
MCH: 27.5 pg (ref 26.0–34.0)
MCHC: 32.3 g/dL (ref 30.0–36.0)
MCV: 85.3 fL (ref 78.0–100.0)
Platelets: 325 10*3/uL (ref 150–400)
RBC: 3.74 MIL/uL — ABNORMAL LOW (ref 3.87–5.11)
RDW: 14.9 % (ref 11.5–15.5)
WBC: 12.5 10*3/uL — AB (ref 4.0–10.5)

## 2017-12-30 LAB — URINALYSIS, ROUTINE W REFLEX MICROSCOPIC
Bilirubin Urine: NEGATIVE
GLUCOSE, UA: NEGATIVE mg/dL
HGB URINE DIPSTICK: NEGATIVE
KETONES UR: NEGATIVE mg/dL
Leukocytes, UA: NEGATIVE
Nitrite: NEGATIVE
PH: 5 (ref 5.0–8.0)
PROTEIN: NEGATIVE mg/dL
Specific Gravity, Urine: 1.011 (ref 1.005–1.030)

## 2017-12-30 LAB — GLUCOSE, CAPILLARY
GLUCOSE-CAPILLARY: 158 mg/dL — AB (ref 65–99)
GLUCOSE-CAPILLARY: 170 mg/dL — AB (ref 65–99)
Glucose-Capillary: 348 mg/dL — ABNORMAL HIGH (ref 65–99)
Glucose-Capillary: 78 mg/dL (ref 65–99)

## 2017-12-30 LAB — TROPONIN I: Troponin I: 4.23 ng/mL (ref <0.03)

## 2017-12-30 LAB — HEPARIN LEVEL (UNFRACTIONATED): Heparin Unfractionated: 0.35 IU/mL (ref 0.30–0.70)

## 2017-12-30 MED ORDER — DIGOXIN 0.25 MG/ML IJ SOLN
0.2500 mg | Freq: Once | INTRAMUSCULAR | Status: AC
  Administered 2017-12-30: 0.25 mg via INTRAVENOUS
  Filled 2017-12-30: qty 1

## 2017-12-30 MED ORDER — LISINOPRIL 5 MG PO TABS: 5.0000 mg | ORAL_TABLET | Freq: Every day | ORAL | Status: DC

## 2017-12-30 NOTE — Progress Notes (Signed)
Progress Note  Patient Name: [name] Date of Encounter: 12/30/2017  Primary Cardiologist: Dr. Katrinka Blazing (new)  Subjective   Feeling well.  Denies chest pain or shortness of breath.  Inpatient Medications    Scheduled Meds: . acetaminophen  500 mg Oral QHS  . aspirin EC  81 mg Oral Daily  . insulin aspart  0-9 Units Subcutaneous TID WC  . isosorbide mononitrate  30 mg Oral Daily  . metoprolol tartrate  12.5 mg Oral BID  . oxybutynin  5 mg Oral TID  . pantoprazole  40 mg Oral Daily  . rosuvastatin  5 mg Oral Daily  . senna  1 tablet Oral Daily  . ticagrelor  90 mg Oral BID   Continuous Infusions: . ceFEPime (MAXIPIME) IV Stopped (12/29/17 1437)  . heparin 700 Units/hr (12/29/17 2217)   PRN Meds: diclofenac sodium, nitroGLYCERIN, ondansetron **OR** ondansetron (ZOFRAN) IV   Vital Signs    Vitals:   12/29/17 1243 12/30/17 0400 12/30/17 0955 12/30/17 1048  BP: 107/63 101/73 97/63 107/73  Pulse: 65 (!) 107 66 72  Resp: (!) 25 (!) 21 19   Temp:  98.6 F (37 C)    TempSrc:      SpO2: 96% 95% 97%   Weight:      Height:        Intake/Output Summary (Last 24 hours) at 12/30/2017 1103 Last data filed at 12/29/2017 1630 Gross per 24 hour  Intake 356.5 ml  Output -  Net 356.5 ml   Filed Weights   12/28/17 1234  Weight: 131 lb 13.4 oz (59.8 kg)    Telemetry    Atrial fibrillation in the 110s alternating with sinus rhythm in the 70s.  - Personally Reviewed  ECG    Sinus tachycardia.  Rate 103 bpm.  LBBB.  PVC.  - Personally Reviewed  Physical Exam   VS:  BP 107/73   Pulse 72   Temp 98.6 F (37 C)   Resp 19   Ht 5' (1.524 m)   Wt 131 lb 13.4 oz (59.8 kg)   SpO2 97%   BMI 25.75 kg/m  , BMI Body mass index is 25.75 kg/m. GENERAL:  Well appearing elderly woman lying comfortably in bed.  No acute distress.  HEENT: Pupils equal round and reactive, fundi not visualized, oral mucosa unremarkable NECK:  No jugular venous distention, waveform within normal  limits, carotid upstroke brisk and symmetric, no bruits, no thyromegaly LUNGS:  Clear to auscultation bilaterally on anterior exam.  HEART:  Irregularly irregular.  Tachycardic.  PMI not displaced or sustained,S1 and S2 within normal limits, no S3, no S4, no clicks, no rubs, no murmurs ABD:  Flat, positive bowel sounds normal in frequency in pitch, no bruits, no rebound, no guarding, no midline pulsatile mass, no hepatomegaly, no splenomegaly EXT:  2 plus pulses throughout, no edema, no cyanosis no clubbing SKIN:  No rashes no nodules NEURO:  Cranial nerves II through XII grossly intact, motor grossly intact throughout [facility]:  Cognitively intact, oriented to person place and time   Labs    Chemistry Recent Labs  Lab 12/28/17 1203 12/28/17 1546 12/29/17 0338 12/30/17 0203  NA 133* 131* 130* 130*  K 4.3 4.4 4.5 4.2  CL 97* 99* 100* 100*  CO2 18*  --  17* 19*  GLUCOSE 230* 218* 204* 155*  BUN 24* 22* 25* 42*  CREATININE 1.33* 0.90 1.08* 1.68*  CALCIUM 9.6  --  8.8* 8.7*  PROT 6.1*  --  6.0*  --   ALBUMIN 3.0*  --  2.9*  --   AST 44*  --  39  --   ALT 12*  --  14  --   ALKPHOS 80  --  82  --   BILITOT 0.7  --  0.8  --   GFRNONAA 33*  --  42* 25*  GFRAA 38*  --  49* 29*  ANIONGAP 18*  --  13 11     Hematology Recent Labs  Lab 12/28/17 1203 12/28/17 1546 12/29/17 0338 12/30/17 0203  WBC 11.0*  --  14.3* 12.5*  RBC 4.19  --  4.28 3.74*  HGB 11.7* 11.6* 12.0 10.3*  HCT 35.8* 34.0* 36.3 31.9*  MCV 85.4  --  84.8 85.3  MCH 27.9  --  28.0 27.5  MCHC 32.7  --  33.1 32.3  RDW 15.0  --  15.0 14.9  PLT 363  --  410* 325    Cardiac Enzymes Recent Labs  Lab 12/29/17 1548 12/29/17 1949 12/30/17 0203  TROPONINI 4.26* 3.79* 4.23*    Recent Labs  Lab 12/28/17 1215  TROPIPOC 1.36*     BNP Recent Labs  Lab 12/28/17 2249  BNP 1,284.8*     DDimer No results for input(s): DDIMER in the last 168 hours.   Radiology    X-ray Chest Pa And Lateral  Result Date:  12/29/2017 CLINICAL DATA:  Episodes of shortness of breath and wheezing today. No chest pain. History of previous MI. EXAM: CHEST  2 VIEW COMPARISON:  Chest x-ray of December 28, 2017 FINDINGS: There is interval increase in the density of the pulmonary interstitium consistent with interstitial edema. The cardiac silhouette is enlarged and its margins indistinct. The pulmonary vascularity is engorged and indistinct. There calcification in the wall of the aortic arch. There is pleural fluid layering posteriorly and inferiorly. There is multilevel degenerative disc disease of the thoracic spine. IMPRESSION: CHF with pulmonary interstitial edema. Moderate sized bilateral pleural effusions layering posteriorly. One cannot exclude basilar pneumonia due to overlying pleural fluid. Electronically Signed   By: David  [place] M.D.   On: 12/29/2017 09:08   Dg Chest Port 1 View  Result Date: 12/28/2017 CLINICAL DATA:  Shortness of breath and chest pressure. EXAM: PORTABLE CHEST 1 VIEW COMPARISON:  Chest x-ray dated November 21, 2017. FINDINGS: Stable cardiomegaly. Normal pulmonary vascularity. Slightly, chronically coarsened interstitial markings are similar to prior study. Increased left retrocardiac and right medial basilar density. Probable small left pleural effusion. No pneumothorax. Unchanged old right-sided rib fractures. No acute osseous abnormality. IMPRESSION: 1. Increasing density at both lung bases may reflect atelectasis or aspiration/pneumonia. 2. New small left pleural effusion. Electronically Signed   By: Obie Dredge M.D.   On: 12/28/2017 12:31    Cardiac Studies   Echo 12/29/17: Study Conclusions  - Left ventricle: The cavity size was normal. Wall thickness was   increased in a pattern of mild LVH. Systolic function was mildly   reduced. The estimated ejection fraction was in the range of 45%   to 50%. Diffuse hypokinesis. - Ventricular septum: Septal motion showed abnormal function and    dyssynergy. - Mitral valve: Calcified annulus. Moderately thickened, moderately   calcified leaflets . There was moderate to severe regurgitation. - Left atrium: The atrium was moderately dilated. Volume/bsa, ES,   (1-plane Simpson&'s, A2C): 48.7 ml/m^2. - Right atrium: The atrium was mildly dilated. - Tricuspid valve: There was moderate regurgitation. - Pulmonary arteries: Systolic pressure was mildly increased.  PA   peak pressure: 38 mm Hg (S).  Impressions:  - When compared to prior, mitral regurgitation has progressed and   EF has decreased.    Patient Profile     84 y.o. female with paroxysmal atrial fibrillation, hypertension, hyperlipidemia and medically managed NSTEMI 11/2017 here with chest pain, shortness of breath and elevated troponin.   Assessment & Plan    # NSTEMI: Troponin was elevated to 4.23.  EKG shows LBBB that was previously noted.  She is not a candidate for intervention.  She was started on metoprolol 12.5mg  bid and Imdur 30mg  daily.  Lisinopril was reduced to 5mg  and has now been held due to acute renal injury.  Continue aspirin and ticagrelor.  We will stop heparin today.  Echo revealed mild, diffuse hypokinesis with LVEF 45-50%.    # Mitral regurgitation: E this admission revealed that her mitral regurgitation has increased to moderate to severe.  When last assessed 11/2017 it was reportedly mild.  Severity of mitral regurgitation can change based on the hemodynamic profile at the time.  On my independent review of her echo it appears that she has thickened mitral valve leaches leaflets and partial restriction of the posterior leaflet.  # Paroxysmal atrial fibrillation:  She has been in and out of atrial fibrillation this admission.  Ms. Spirk is not anticoagulated due to a fall history.  She has been on dual antiplatelet therapy.  Metoprolol was started this admission.  Her heart rate remains poorly-controlled and her blood pressure is low.  Will give 1 dose of  IV digoxin.  We will avoid standing dosing given her renal dysfunction.  # Hypertension: Holding lisinopril 2/2 AKI.  Continue metoprolol.   # Dispo: Patient and family are considering hospice.  We will continue to work to optimize her medications for comfort and quality.   For questions or updates, please contact [facility] Please consult [email or web address] for contact info under Cardiology/STEMI.      Signed, Chilton Si, MD  12/30/2017, 11:03 AM

## 2017-12-30 NOTE — Progress Notes (Addendum)
PROGRESS NOTE   [name]  ZOX:[id number]    DOB: March 13, 1920    DOA: 12/28/2017  PCP: Lavell Islam, MD   I have briefly reviewed patients previous medical records in [facility].  Brief Narrative:  84 year old female, ALF resident, PMH of recent NSTEMI/medically managed, PAF not on anticoagulation due to history of falls (followed by Dr. Alonza Bogus with [facility]), HTN, HLD, GERD, presented to ED with chest pain, 1-2 days progressive dyspnea and recent cough.  Admitted for sepsis due to HCAP Vs aspiration pneumonia, decompensated CHF, lactic acidosis and elevated troponin.  Cardiology and palliative care consulted.   Assessment & Plan:   Active Problems:   Essential hypertension   GERD with stricture   NSTEMI (non-ST elevation myocardial infarction) (HCC)   Healthcare-associated pneumonia   Infectious systemic inflammatory response syndrome (SIRS) (HCC)   HCAP (healthcare-associated pneumonia)   Palliative care encounter   Goals of care, counseling/discussion   Paroxysmal atrial fibrillation (HCC)   1. Sepsis due to possible pneumonia: Received IV fluids per sepsis protocol in ED but IV fluids then discontinued due to concern for decompensated CHF.  Empirically started on IV vancomycin, cefepime and Flagyl.  Blood cultures x 2, Neg to date, MRSA PCR negative.  Discussed with pharmacy 1/23 and discontinued vancomycin.  Continue cefepime for now.  Follow-up repeat chest x-ray and consider transitioning to oral antibiotics. 2. Suspected aspiration pneumonia versus HCAP: Continue IV cefepime and can transition to oral antibiotics.  Discontinued vancomycin and Flagyl.  Speech therapy recommends dysphagia 3 diet and thin liquids. 3. Dysphagia: Speech therapy follow-up appreciated and diet as above. 4. 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  TTE 12/17: LVEF 50-55% and grade 1  diastolic dysfunction.  Chest x-ray showed persistent pulmonary edema. Repeat 2D echo: LVEF 45-50%, diffuse hypokinesis and moderate to severe MR.  Cardiology input appreciated and indicate overall poor prognosis and recommend palliative care consultation, who are following.  BNP 1284.8.  Clinically euvolemic.  Lasix discontinued due to acute kidney injury. 5. Elevated troponin/demand ischemia versus NSTEMI: Cardiology input appreciated.  Currently on IV heparin drip, aspirin and Brillinta.  Repeat 2D echo, results as above.  Await cardiology follow-up.  Peak troponin 4.23.  May be could stop heparin today. 6. Lactic acidosis: Secondary to sepsis and decompensated CHF.  Improved, 6.4 > 2.8 7. Essential HTN: Controlled.  Low-dose metoprolol added.  Hold lisinopril today due to acute kidney injury. 8. Hyperlipidemia: Continue Crestor 9. PAF: Currently in A. fib with controlled ventricular rate.  Not anticoagulation candidate secondary to falls. 10. Acute respiratory failure with hypoxia: Secondary to pneumonia and decompensated CHF.  Wean oxygen as tolerated.  Treat as above.  Discussed oxygen wean with RN. 11. Acute kidney injury: Creatinine went up from 1.08 on 1/23-1.68, likely due to Lasix and lisinopril, hold both.  Encourage oral fluids.  Follow BMP in a.m. 12. GERD: Continue PPI. 13. Hyponatremia: Stable.   14. Adult failure to thrive: Multifactorial related to very advanced age, frail physical status and multiple severe significant medical comorbidities.  Palliative care input much appreciated, [facility] when medically optimized, DNR. 15. DM 2, new: SSI.  A1c 6.6. 16. Anemia: Hemoglobin is dropped from 12-10.3 in the absence of overt bleeding.  Follow CBC in a.m.   DVT prophylaxis: Currently on IV heparin drip Code Status: DNR Family Communication:  Discussed with patient's son, updated care and answered questions. Disposition: DC to prior ALF pending clinical  improvement.   Consultants:  Cardiology Palliative care team  Procedures:  None  Antimicrobials:  IV cefepime IV vancomycin and Flagyl discontinued   Subjective: Patient interviewed and examined with RN in room.  Denies dyspnea, chest pain or cough.  No fever or chills reported.  ROS: As above  Objective:  Vitals:   12/29/17 0705 12/29/17 0754 12/29/17 1243 12/30/17 0400  BP: (!) 96/53 115/69 107/63 101/73  Pulse: (!) 102 (!) 141 65 (!) 107  Resp: (!) 26 20 (!) 25 (!) 21  Temp:  98.8 F (37.1 C)  98.6 F (37 C)  TempSrc:  Oral    SpO2: 93% 92% 96% 95%  Weight:      Height:        Examination:  General exam: Elderly female, small built and frail, sitting up comfortably in chair this morning, looks much improved compared to yesterday. Respiratory system: Bronchial breath sounds bibasilarly.  Occasional basal crackles.  Rest of lung fields clear to auscultation Cardiovascular system: S1 & S2 heard, RRR. No JVD, murmurs, rubs, gallops or clicks. No pedal edema.  Telemetry personally reviewed: A. fib with BBB morphology with rate in the 90-100 range. Gastrointestinal system: Abdomen is nondistended, soft and nontender. No organomegaly or masses felt. Normal bowel sounds heard.  Stable without change Central nervous system: Alert and oriented x2. No focal neurological deficits.  Stable without change Extremities: Symmetric 5 x 5 power. Skin: Chronic hyperpigmentation of bilateral legs.  Also multiple superficial bruising over lower extremities. Psychiatry: Judgement and insight impaired. Mood & affect appropriate.     Data Reviewed: I have personally reviewed following labs and imaging studies  CBC: Recent Labs  Lab 12/28/17 1203 12/28/17 1546 12/29/17 0338 12/30/17 0203  WBC 11.0*  --  14.3* 12.5*  NEUTROABS 10.3*  --   --   --   HGB 11.7* 11.6* 12.0 10.3*  HCT 35.8* 34.0* 36.3 31.9*  MCV 85.4  --  84.8 85.3  PLT 363  --  410* 325   Basic Metabolic  Panel: Recent Labs  Lab 12/28/17 1203 12/28/17 1546 12/29/17 0338 12/30/17 0203  NA 133* 131* 130* 130*  K 4.3 4.4 4.5 4.2  CL 97* 99* 100* 100*  CO2 18*  --  17* 19*  GLUCOSE 230* 218* 204* 155*  BUN 24* 22* 25* 42*  CREATININE 1.33* 0.90 1.08* 1.68*  CALCIUM 9.6  --  8.8* 8.7*   Liver Function Tests: Recent Labs  Lab 12/28/17 1203 12/29/17 0338  AST 44* 39  ALT 12* 14  ALKPHOS 80 82  BILITOT 0.7 0.8  PROT 6.1* 6.0*  ALBUMIN 3.0* 2.9*   Coagulation Profile: No results for input(s): INR, PROTIME in the last 168 hours. Cardiac Enzymes: Recent Labs  Lab 12/29/17 1548 12/29/17 1949 12/30/17 0203  TROPONINI 4.26* 3.79* 4.23*   HbA1C: Recent Labs    12/29/17 1548  HGBA1C 6.6*   CBG: Recent Labs  Lab 12/29/17 1624 12/30/17 0618  GLUCAP 155* 158*    Recent Results (from the past 240 hour(s))  Blood Culture (routine x 2)     Status: None (Preliminary result)   Collection Time: 12/28/17 12:45 PM  Result Value Ref Range Status   Specimen Description BLOOD RIGHT ANTECUBITAL  Final   Special Requests   Final    BOTTLES DRAWN AEROBIC AND ANAEROBIC Blood Culture adequate volume   Culture NO GROWTH < 24  HOURS  Final   Report Status PENDING  Incomplete  Blood Culture (routine x 2)     Status: None (Preliminary result)   Collection Time: 12/28/17 12:55 PM  Result Value Ref Range Status   Specimen Description BLOOD RIGHT HAND  Final   Special Requests   Final    BOTTLES DRAWN AEROBIC AND ANAEROBIC Blood Culture adequate volume   Culture NO GROWTH < 24 HOURS  Final   Report Status PENDING  Incomplete  MRSA PCR Screening     Status: None   Collection Time: 12/28/17  7:04 PM  Result Value Ref Range Status   MRSA by PCR NEGATIVE NEGATIVE Final    Comment:        The GeneXpert MRSA Assay (FDA approved for NASAL specimens only), is one component of a comprehensive MRSA colonization surveillance program. It is not intended to diagnose MRSA infection nor to guide  or monitor treatment for MRSA infections.          Radiology Studies: X-ray Chest Pa And Lateral  Result Date: 12/29/2017 CLINICAL DATA:  Episodes of shortness of breath and wheezing today. No chest pain. History of previous MI. EXAM: CHEST  2 VIEW COMPARISON:  Chest x-ray of December 28, 2017 FINDINGS: There is interval increase in the density of the pulmonary interstitium consistent with interstitial edema. The cardiac silhouette is enlarged and its margins indistinct. The pulmonary vascularity is engorged and indistinct. There calcification in the wall of the aortic arch. There is pleural fluid layering posteriorly and inferiorly. There is multilevel degenerative disc disease of the thoracic spine. IMPRESSION: CHF with pulmonary interstitial edema. Moderate sized bilateral pleural effusions layering posteriorly. One cannot exclude basilar pneumonia due to overlying pleural fluid. Electronically Signed   By: David  [place] M.D.   On: 12/29/2017 09:08   Dg Chest Port 1 View  Result Date: 12/28/2017 CLINICAL DATA:  Shortness of breath and chest pressure. EXAM: PORTABLE CHEST 1 VIEW COMPARISON:  Chest x-ray dated November 21, 2017. FINDINGS: Stable cardiomegaly. Normal pulmonary vascularity. Slightly, chronically coarsened interstitial markings are similar to prior study. Increased left retrocardiac and right medial basilar density. Probable small left pleural effusion. No pneumothorax. Unchanged old right-sided rib fractures. No acute osseous abnormality. IMPRESSION: 1. Increasing density at both lung bases may reflect atelectasis or aspiration/pneumonia. 2. New small left pleural effusion. Electronically Signed   By: Obie Dredge M.D.   On: 12/28/2017 12:31        Scheduled Meds: . acetaminophen  500 mg Oral QHS  . aspirin EC  81 mg Oral Daily  . insulin aspart  0-9 Units Subcutaneous TID WC  . isosorbide mononitrate  30 mg Oral Daily  . [START ON 12/31/2017] lisinopril  5 mg Oral Daily   . metoprolol tartrate  12.5 mg Oral BID  . oxybutynin  5 mg Oral TID  . pantoprazole  40 mg Oral Daily  . rosuvastatin  5 mg Oral Daily  . senna  1 tablet Oral Daily  . ticagrelor  90 mg Oral BID   Continuous Infusions: . ceFEPime (MAXIPIME) IV Stopped (12/29/17 1437)  . heparin 700 Units/hr (12/29/17 2217)     LOS: 2 days     Marcellus Scott, MD, FACP, [facility]. [facility] Pager [phone]  If 7PM-7AM, please contact night-coverage [email or web address] Password [email or web address] 12/30/2017, 9:22 AM

## 2017-12-30 NOTE — Progress Notes (Signed)
  Speech Language Pathology Treatment: Dysphagia  Patient Details Name: [name] MRN: [id number] DOB: 03-01-1920 Today's Date: 12/30/2017 Time: 7619-5093 SLP Time Calculation (min) (ACUTE ONLY): 21 min  Assessment / Plan / Recommendation Clinical Impression  Pt up in chair this am, drinking coffee, juice and mil also at bedside despite sign and recommendation for water only. Regardless, pts respiratory rate has improved, no audible wheezing and pt able to take over 5 oz of liquids without immediate or delayed coughing today. Provided cereal with milk and pt self fed this soft texture with prolonged oral preparation given missing dentition, but no difficulty. Again suspect pts primary risk is likely post prandial aspiration associated with esophageal deficits common in advanced age. Offered basic esophageal precautions, primarily the importance of eating and drinking while upright in chair and not in bed. Will liberalize diet to include finely chopped solids and liquids of choice given improvement. Pt in agreement.   HPI HPI: 84 year old female, history of high blood pressure, hypertension, acid reflux and atrial fibrillation, also with a history of a myocardial infarction, she currently takes Brilinta, aspirin, and his DO NOT RESUSCITATE based on her wishes. She currently lives at [facility] in [place], reports were that the patient awoke at approximately 2:30 in the morning and was having chest pain or shortness of breath. Evidently she was wheezing and short of breath and was given a nebulized treatment with some improvement, she was also given nitroglycerin and aspirin. Found to have bibasilar infiltartes on CXR.       SLP Plan  Continue with current plan of care       Recommendations  Diet recommendations: Dysphagia 3 (mechanical soft);Thin liquid Liquids provided via: Cup;Straw Medication Administration: Whole meds with puree Supervision: Patient able to self  feed Compensations: Slow rate;Small sips/bites;Follow solids with liquid Postural Changes and/or Swallow Maneuvers: Seated upright 90 degrees;Upright 30-60 min after meal;Out of bed for meals                Oral Care Recommendations: Oral care BID Follow up Recommendations: Skilled Nursing facility SLP Visit Diagnosis: Dysphagia, unspecified (R13.10) Plan: Continue with current plan of care       GO               [facility], [place] CCC-SLP [phone]  [name] 12/30/2017, 9:07 AM

## 2017-12-30 NOTE — Progress Notes (Signed)
ANTICOAGULATION CONSULT NOTE Pharmacy Consult for heparin Indication: chest pain/ACS  Allergies  Allergen Reactions  . Amoxicillin Itching    Patient Measurements: Height: 5' (152.4 cm) Weight: 131 lb 13.4 oz (59.8 kg) IBW/kg (Calculated) : 45.5 Heparin Dosing Weight: 57.8kg  Vital Signs: Temp: 98.6 F (37 C) (01/24 0400) BP: 101/73 (01/24 0400) Pulse Rate: 107 (01/24 0400)  Labs: Recent Labs    12/28/17 1203 12/28/17 1546 12/28/17 2113 12/29/17 0338 12/29/17 1548 12/29/17 1949 12/30/17 0203  HGB 11.7* 11.6*  --  12.0  --   --  10.3*  HCT 35.8* 34.0*  --  36.3  --   --  31.9*  PLT 363  --   --  410*  --   --  325  HEPARINUNFRC  --   --  0.38 0.36  --   --  0.35  CREATININE 1.33* 0.90  --  1.08*  --   --  1.68*  TROPONINI  --   --   --   --  4.26* 3.79* 4.23*    Estimated Creatinine Clearance: 16.2 mL/min (A) (by C-G formula based on SCr of 1.68 mg/dL (H)).   Medical History: Past Medical History:  Diagnosis Date  . Arthritis    "mostly joints; pretty much all over" (12/28/2017)  . Atrial fibrillation (HCC)   . GERD (gastroesophageal reflux disease)   . Hiatal hernia   . History of kidney stones   . HLD (hyperlipidemia)   . HTN (hypertension)   . MI (myocardial infarction) (HCC) 11/21/2017  . NSTEMI (non-ST elevated myocardial infarction) (HCC) 12/28/2017  . Pneumonia 12/28/2017  . Thyroid disorder   . Urinary incontinence     Medications:  Infusions:  . ceFEPime (MAXIPIME) IV Stopped (12/29/17 1437)  . heparin 700 Units/hr (12/29/17 2217)    Assessment: 89 yof presented to the ED with weakness and CP. Troponin elevated and on starting IV heparin. She not on anticoagulation despite history of afib as she was not deemed to be a good anticoagulation candidate. She is noted with a recent NSTEMI in 11/2017. No plans noted for invasive interventions.  Heparin level at goal this am without complications noted overnight. Plan per cardiology is to continue  heparin for 48 hours for medical management. Heparin started 1/22 @1300  so should be able to stop this afternoon.  Goal of Therapy:  Heparin level 0.3-0.7 units/ml Monitor platelets by anticoagulation protocol: Yes   Plan:  Continue IV heparin at current rate. Daily heparin level and CBC Will follow plans for length of therapy  Sheppard Coil PharmD., BCPS Clinical Pharmacist 12/30/2017 8:31 AM

## 2017-12-30 NOTE — Progress Notes (Addendum)
Patient has a change in condition since this morning. Pt VSS but feels like she is ready to die that her time has come. Called patient's son Earvin Hansen and he will come shortly.  Called Dr. Waymon Amato and made aware of patient's change in status. Patient says she does not hurt, does not feel nausea, but knows it is her time and she is ready to go. I stayed with patient until niece arrived and she went to sleep. Patient does not want oxygen or anything to eat or drink.  Patient is sleeping with mouth open and periods of apnea without dPatient has swabs in room and attempted oral care.  Family at bedside. Pt resting with call bell within reach.  Will continue to monitor. Thomas Hoff, RN

## 2017-12-31 DIAGNOSIS — Z515 Encounter for palliative care: Secondary | ICD-10-CM

## 2017-12-31 LAB — BASIC METABOLIC PANEL
ANION GAP: 11 (ref 5–15)
BUN: 40 mg/dL — ABNORMAL HIGH (ref 6–20)
CHLORIDE: 100 mmol/L — AB (ref 101–111)
CO2: 22 mmol/L (ref 22–32)
CREATININE: 1.36 mg/dL — AB (ref 0.44–1.00)
Calcium: 9.1 mg/dL (ref 8.9–10.3)
GFR calc Af Amer: 37 mL/min — ABNORMAL LOW (ref >60)
GFR calc non Af Amer: 32 mL/min — ABNORMAL LOW (ref >60)
GLUCOSE: 102 mg/dL — AB (ref 65–99)
Potassium: 4.2 mmol/L (ref 3.5–5.1)
Sodium: 133 mmol/L — ABNORMAL LOW (ref 135–145)

## 2017-12-31 LAB — CBC
HCT: 32.8 % — ABNORMAL LOW (ref 36.0–46.0)
HEMOGLOBIN: 10.7 g/dL — AB (ref 12.0–15.0)
MCH: 27.9 pg (ref 26.0–34.0)
MCHC: 32.6 g/dL (ref 30.0–36.0)
MCV: 85.4 fL (ref 78.0–100.0)
Platelets: 312 10*3/uL (ref 150–400)
RBC: 3.84 MIL/uL — AB (ref 3.87–5.11)
RDW: 14.9 % (ref 11.5–15.5)
WBC: 11.1 10*3/uL — ABNORMAL HIGH (ref 4.0–10.5)

## 2017-12-31 LAB — GLUCOSE, CAPILLARY: Glucose-Capillary: 105 mg/dL — ABNORMAL HIGH (ref 65–99)

## 2017-12-31 MED ORDER — DIGOXIN 125 MCG PO TABS
0.3750 mg | ORAL_TABLET | Freq: Once | ORAL | Status: AC
  Administered 2017-12-31: 0.375 mg via ORAL
  Filled 2017-12-31: qty 3

## 2017-12-31 MED ORDER — MORPHINE SULFATE (CONCENTRATE) 10 MG/0.5ML PO SOLN
2.5000 mg | Freq: Four times a day (QID) | ORAL | Status: DC
Start: 2017-12-31 — End: 2017-12-31
  Administered 2017-12-31: 12:00:00 via ORAL
  Filled 2017-12-31: qty 0.5

## 2017-12-31 MED ORDER — METOPROLOL TARTRATE 25 MG PO TABS: 12.5000 mg | ORAL_TABLET | Freq: Two times a day (BID) | ORAL | Status: AC

## 2017-12-31 MED ORDER — FUROSEMIDE 20 MG PO TABS: 20.0000 mg | ORAL_TABLET | Freq: Every day | ORAL | Status: AC

## 2017-12-31 MED ORDER — ASPIRIN 81 MG PO CHEW
CHEWABLE_TABLET | ORAL | Status: AC
  Administered 2017-12-31: 13:00:00
  Filled 2017-12-31: qty 1

## 2017-12-31 MED ORDER — DIGOXIN 125 MCG PO TABS: 0.1250 mg | ORAL_TABLET | Freq: Four times a day (QID) | ORAL | Status: DC

## 2017-12-31 MED ORDER — MORPHINE SULFATE (CONCENTRATE) 10 MG/0.5ML PO SOLN: 2.5000 mg | Freq: Four times a day (QID) | ORAL | Status: AC

## 2017-12-31 MED ORDER — MORPHINE SULFATE (CONCENTRATE) 10 MG/0.5ML PO SOLN
5.0000 mg | ORAL | Status: DC | PRN
  Administered 2017-12-31: 5 mg via SUBLINGUAL
  Filled 2017-12-31: qty 0.5

## 2017-12-31 MED ORDER — DIGOXIN 125 MCG PO TABS: 0.1250 mg | ORAL_TABLET | Freq: Every day | ORAL | Status: DC

## 2017-12-31 MED ORDER — MORPHINE SULFATE (CONCENTRATE) 10 MG/0.5ML PO SOLN: 5.0000 mg | ORAL | Status: AC | PRN

## 2017-12-31 MED ORDER — DIGOXIN 125 MCG PO TABS: 0.1250 mg | ORAL_TABLET | Freq: Every day | ORAL | Status: AC

## 2017-12-31 NOTE — Care Management Important Message (Signed)
Important Message  Patient Details  Name: CHALINA DENIGRIS MRN: [id number] Date of Birth: [birth year]   Medicare Important Message Given:  Yes    Lawerance Sabal, RN 12/31/2017, 11:12 AM

## 2017-12-31 NOTE — Progress Notes (Signed)
[facility] Liaison:  RN visit  Received request from Richardo Hanks, for family interest in [facility] with request for transfer today.  Chart reviewed and patient is eligible.  Met with Berneta Sages, son, to confirm interest and explain services.  Family agreeable to transfer today.  Richardo Hanks, aware.  Registration paperwork completed at 02:23pm today.  Dr. Orpah Melter to assume care per family request.  Please fax discharge summary to [phone].  RN, please call report to [phone].  Please arrange transport for patient to arrive as soon as possible.   Thank you for this referral.   Edyth Gunnels, RN, [facility] Liaison [phone]  All hospital liaisons are now on [place].

## 2017-12-31 NOTE — Progress Notes (Signed)
Daily Progress Note   Patient Name: [name]       Date: 12/31/2017 DOB: [birth year]  Age: 84 y.o. MRN#: [id number] Attending Physician: Elease Etienne, MD Primary Care Physician: Lavell Islam, MD Admit Date: 12/28/2017  Reason for Consultation/Follow-up: Establishing goals of care  Subjective: Patient is very pleasant but complains - She states she is ready to die - further she is uncomfortable here in the [name].  Little things like the temperature in her room, not being feed, being woken for vital signs, cold food - these things are making her uncomfortable.  He son feels as though she is not going to improve further.  He understands she is likely dying.  He is agreeable with getting her out of the [name] to a more comfortable environment.    He questions where she should go on d/c.  He is concerned that [name] will not take her back (even with [name]).  He asks should she go to [name]? Or should she go to a higher level at [name] with [name].   Assessment: 84 yo very frail pleasant lady.  Slightly SOB.  Tachycardic.   Patient Profile/HPI:  84 y.o. female  with past medical history of Afib, MI (December 2018), arthritis, thyroid disease, and total hip replacement who was admitted on 12/28/2017 with lethargy and sepsis secondary to HCAP vs acute heart failure exacerbation or a combination of the two.  At the time of admission [name]'s pulse rate was 141, lactic acid was 6.4, BNP was 1284 and troponin was elevated.   Cardiology consulted and noted that the patient is not a candidate for invasive or advanced heart failure therapy.   Cardiology discussed [name] and palliative care the the son.  Length of Stay: 3  Current Medications: Scheduled  Meds:  . acetaminophen  500 mg Oral QHS  . aspirin EC  81 mg Oral Daily  . insulin aspart  0-9 Units Subcutaneous TID WC  . isosorbide mononitrate  30 mg Oral Daily  . metoprolol tartrate  12.5 mg Oral BID  . oxybutynin  5 mg Oral TID  . pantoprazole  40 mg Oral Daily  . rosuvastatin  5 mg Oral Daily  . senna  1 tablet Oral Daily  . ticagrelor  90 mg Oral BID  Continuous Infusions: . ceFEPime (MAXIPIME) IV Stopped (12/30/17 1521)    PRN Meds: diclofenac sodium, nitroGLYCERIN, ondansetron **OR** ondansetron (ZOFRAN) IV  Physical Exam        Frail elderly female, SOB, soft but clear speech.  Coherent CV irreg Resp slightly increased work of breathing on N/C Abdomen soft, nt  Vital Signs: BP 121/76 (BP Location: Right Arm)   Pulse 79   Temp 98.1 F (36.7 C) (Oral)   Resp (!) 22   Ht 5' (1.524 m)   Wt 59.8 kg (131 lb 13.4 oz)   SpO2 98%   BMI 25.75 kg/m  SpO2: SpO2: 98 % O2 Device: O2 Device: Nasal Cannula O2 Flow Rate: O2 Flow Rate (L/min): 1 L/min  Intake/output summary: No intake or output data in the 24 hours ending 12/31/17 1008 LBM: Last BM Date: (unknown) Baseline Weight: Weight: 59.8 kg (131 lb 13.4 oz) Most recent weight: Weight: 59.8 kg (131 lb 13.4 oz)       Palliative Assessment/Data: 20%      Patient Active Problem List   Diagnosis Date Noted  . Acute combined systolic and diastolic heart failure (HCC)   . Persistent atrial fibrillation (HCC)   . HCAP (healthcare-associated pneumonia)   . Palliative care encounter   . Goals of care, counseling/discussion   . Paroxysmal atrial fibrillation (HCC)   . NSTEMI (non-ST elevation myocardial infarction) (HCC) 12/28/2017  . Healthcare-associated pneumonia 12/28/2017  . Infectious systemic inflammatory response syndrome (SIRS) (HCC) 12/28/2017  . Chest pain 11/22/2017  . NSTEMI (non-ST elevated myocardial infarction) (HCC) 11/21/2017  . Fall 04/03/2015  . Acute blood loss anemia 04/03/2015  .  Multiple rib fractures 04/01/2015  . GERD with stricture 11/23/2011  . HYPERLIPIDEMIA 03/07/2010  . Essential hypertension 03/07/2010    Palliative Care Plan    Recommendations/Plan:  [name] services on discharge.  Patient is eligible for [name], but she loves her home at [name].    Will defer to Social Work and Case Management about disposition - [name] w/ [name], NH with [name], or [name]  D/C interventions not directly related to comfort including medications, tele, pulse ox, etc  Will schedule roxanol and have additional SL morphine available PRN pain or SOB  Discussed with [name] attending and Cardiology.  Cardiology planning to progress to oral rate control medication this morning.  Goals of Care and Additional Recommendations:  Limitations on Scope of Treatment: Full Comfort Care  Code Status:  DNR  Prognosis:   < 2 weeks secondary to advanced age, advanced CAD, respiratory failure, recurrent volume overload, and patient's will to live.   Discharge Planning:  To Be Determined DISCHARGE WITH [name] SERVICES to [name], [name] w [name] or NH w [name]  Care plan was discussed with [name] and Cardiology as well as patients son [name].  Thank you for allowing the Palliative Medicine Team to assist in the care of this patient.  Total time spent:  60 min.     Greater than 50%  of this time was spent counseling and coordinating care related to the above assessment and plan.  Norvel Richards, PA-C Palliative Medicine  Please contact Palliative MedicineTeam phone at [phone] for questions and concerns between 7 am - 7 pm.   Please see AMION for individual provider pager numbers.

## 2017-12-31 NOTE — Clinical Social Work Note (Deleted)
Pt has been accepted at [facility]. Dr. Jama Flavors is the accepting MD. Pt will go to room 306, bed 2. RN to call report [phone]. Signed Voluntary form on pt's chart.   RN to contact CSW when pt prepped for transport.  [place], [place] [phone]\

## 2017-12-31 NOTE — Clinical Social Work Note (Signed)
Clinical Social Worker facilitated patient discharge including contacting patient family and facility to confirm patient discharge plans.  Clinical information faxed to facility and family agreeable with plan.  CSW arranged ambulance transport via [facility] to [facility] .  RN to call [phone] for report prior to discharge.  Clinical Social Worker will sign off for now as social work intervention is no longer needed. Please consult us again if new need arises.  Jackelyne Sayer, LCSWA [phone]   

## 2017-12-31 NOTE — Clinical Social Work Note (Signed)
CSW spoke with pt's son. He agrees that pt should go to hospice home given her condition and health needs being out of the scope of what ALF entails. CSW explained hospice referral process--pt's son expressed understanding. Pt's son wants pt to stay in [place]. CSW will reach out to [facility].   [place], [place] [phone]

## 2017-12-31 NOTE — Care Management Note (Signed)
Case Management Note Previous CM note completed by Elliot Cousin, RN--12/29/2017, 3:38 PM   Patient Details  Name: [name] MRN: [id number] Date of Birth: 25-May-1920  Subjective/Objective:    NSTEMI, afib, HTN                Action/Plan: Discharge Planning: Spoke to pt and son, Earvin Hansen at bedside. Pt is from [facility]. They do have SNF available. Pt will be seen by Palliative and son understand. States she is not at her baseline. She was walking with RW to meals and she has been lethargic here at hospital. SLP worked with pt and she is on a puree diet. Son states she ate her roast beef and pears. She has been eating much the past few days at ALF. Contacted CSW with referral back to [facility]F. Has RW and wheelchair. She uses oxygen intermittently at [facility] but son does not believe it is a set up for oxygen for her at facility. Pt may need oxygen at dc. Will need qualifying sats. Made Unit RN aware. Will check on dc day, pt is improving daily. Will arrange oxygen at dc. Spoke to [facility] and they only have a standing O2 at 2L prn. They use [facility] for oxygen. [facility] will arrange Hospice.  PCP Lavell Islam MD   Expected Discharge Date:                Expected Discharge Plan:  Hospice Medical Facility  In-House Referral:  Clinical Social Work  Discharge planning Services  CM Consult  Post Acute Care Choice:  NA Choice offered to:  NA  DME Arranged:  N/A DME Agency:  NA  HH Arranged:  NA HH Agency:  NA  Status of Service:  In process, will continue to follow  If discussed at Long Length of Stay Meetings, dates discussed:    Discharge Disposition: hospice facility   Additional Comments:  12/31/17- 1100- Donn Pierini RN, CM- noted referral for home with hospice- pt from ALF and at this time son is trying to figure out best plan for pt- ?hospice home ?return to ALF with Hospice if able- CSW to follow up with pt and son regarding  best transition plan- pt will most likely need residential hospice.   Zenda Alpers [place], RN 12/31/2017, 12:34 PM [phone]

## 2017-12-31 NOTE — Progress Notes (Signed)
PT Cancellation Note  Patient Details Name: BLESSIN GEERTS MRN: [id number] DOB: [birth year]   Cancelled Treatment:    Reason Eval/Treat Not Completed: (P) Medical issues which prohibited therapy(Pt with medical decline and arranged for transfer to [facility] this afternoon, will cancel treatment as order is d/c'd.  )   Florestine Avers 12/31/2017, 3:43 PM  Joycelyn Rua, PTA pager [phone]

## 2017-12-31 NOTE — Progress Notes (Signed)
Progress Note  Patient Name: [name] Date of Encounter: 12/31/2017  Primary Cardiologist: Dr. Katrinka Blazing (new)  Subjective   Feeling well.  She is a bit disgruntled about her service overnight and not sleeping well but is otherwise without complaint.  Inpatient Medications    Scheduled Meds: . acetaminophen  500 mg Oral QHS  . isosorbide mononitrate  30 mg Oral Daily  . metoprolol tartrate  12.5 mg Oral BID  . morphine CONCENTRATE  2.6 mg Oral Q6H  . oxybutynin  5 mg Oral TID  . pantoprazole  40 mg Oral Daily  . senna  1 tablet Oral Daily  . ticagrelor  90 mg Oral BID   Continuous Infusions: . ceFEPime (MAXIPIME) IV Stopped (12/30/17 1521)   PRN Meds: diclofenac sodium, morphine CONCENTRATE, nitroGLYCERIN, ondansetron **OR** ondansetron (ZOFRAN) IV   Vital Signs    Vitals:   12/30/17 2116 12/30/17 2119 12/31/17 0000 12/31/17 0356  BP: 114/74  (!) 94/50 121/76  Pulse: (!) 139 92 66 79  Resp: (!) 29  13 (!) 22  Temp: (!) 97.5 F (36.4 C)   98.1 F (36.7 C)  TempSrc: Oral   Oral  SpO2: 92%  97% 98%  Weight:      Height:       No intake or output data in the 24 hours ending 12/31/17 1018 Filed Weights   12/28/17 1234  Weight: 131 lb 13.4 oz (59.8 kg)    Telemetry    Atrial fibrillation in the 110s alternating with sinus rhythm in the 70s.  - Personally Reviewed  ECG    Sinus tachycardia.  Rate 103 bpm.  LBBB.  PVC.  - Personally Reviewed  Physical Exam   VS:  BP 121/76 (BP Location: Right Arm)   Pulse 79   Temp 98.1 F (36.7 C) (Oral)   Resp (!) 22   Ht 5' (1.524 m)   Wt 131 lb 13.4 oz (59.8 kg)   SpO2 98%   BMI 25.75 kg/m  , BMI Body mass index is 25.75 kg/m. GENERAL:  Well appearing elderly woman lying comfortably in bed.  No acute distress.  HEENT: Pupils equal round and reactive, fundi not visualized, oral mucosa unremarkable NECK:  No jugular venous distention, waveform within normal limits, carotid upstroke brisk and symmetric, no bruits,  no thyromegaly LUNGS:  Clear to auscultation bilaterally on anterior exam.  HEART:  Irregularly irregular.  Tachycardic.  PMI not displaced or sustained,S1 and S2 within normal limits, no S3, no S4, no clicks, no rubs, no murmurs ABD:  Flat, positive bowel sounds normal in frequency in pitch, no bruits, no rebound, no guarding, no midline pulsatile mass, no hepatomegaly, no splenomegaly EXT:  2 plus pulses throughout, no edema, no cyanosis no clubbing SKIN:  No rashes no nodules NEURO:  Cranial nerves II through XII grossly intact, motor grossly intact throughout [facility]:  Cognitively intact, oriented to person place and time   Labs    Chemistry Recent Labs  Lab 12/28/17 1203  12/29/17 0338 12/30/17 0203 12/31/17 0416  NA 133*   < > 130* 130* 133*  K 4.3   < > 4.5 4.2 4.2  CL 97*   < > 100* 100* 100*  CO2 18*  --  17* 19* 22  GLUCOSE 230*   < > 204* 155* 102*  BUN 24*   < > 25* 42* 40*  CREATININE 1.33*   < > 1.08* 1.68* 1.36*  CALCIUM 9.6  --  8.8* 8.7*  9.1  PROT 6.1*  --  6.0*  --   --   ALBUMIN 3.0*  --  2.9*  --   --   AST 44*  --  39  --   --   ALT 12*  --  14  --   --   ALKPHOS 80  --  82  --   --   BILITOT 0.7  --  0.8  --   --   GFRNONAA 33*  --  42* 25* 32*  GFRAA 38*  --  49* 29* 37*  ANIONGAP 18*  --  13 11 11    < > = values in this interval not displayed.     Hematology Recent Labs  Lab 12/29/17 0338 12/30/17 0203 12/31/17 0416  WBC 14.3* 12.5* 11.1*  RBC 4.28 3.74* 3.84*  HGB 12.0 10.3* 10.7*  HCT 36.3 31.9* 32.8*  MCV 84.8 85.3 85.4  MCH 28.0 27.5 27.9  MCHC 33.1 32.3 32.6  RDW 15.0 14.9 14.9  PLT 410* 325 312    Cardiac Enzymes Recent Labs  Lab 12/29/17 1548 12/29/17 1949 12/30/17 0203  TROPONINI 4.26* 3.79* 4.23*    Recent Labs  Lab 12/28/17 1215  TROPIPOC 1.36*     BNP Recent Labs  Lab 12/28/17 2249  BNP 1,284.8*     DDimer No results for input(s): DDIMER in the last 168 hours.   Radiology    Dg Chest 2 View  Result  Date: 12/30/2017 CLINICAL DATA:  Hypoxia EXAM: CHEST  2 VIEW COMPARISON:  December 29, 2017 FINDINGS: There is cardiomegaly with pulmonary venous hypertension. There are pleural effusions bilaterally with interstitial pulmonary edema. There is slightly less interstitial edema compared to 1 day prior. There is atelectatic change in lung bases. There is aortic atherosclerosis. There is degenerative change in the thoracic spine. IMPRESSION: Lungs felt to be indicative of congestive heart failure, stable. Note that there is slightly less interstitial edema compared to 1 day prior. Pleural effusions appear essentially stable. Aortic atherosclerosis. Bibasilar atelectasis. No new opacity evident. Electronically Signed   By: Bretta Bang III M.D.   On: 12/30/2017 13:44    Cardiac Studies   Echo 12/29/17: Study Conclusions  - Left ventricle: The cavity size was normal. Wall thickness was   increased in a pattern of mild LVH. Systolic function was mildly   reduced. The estimated ejection fraction was in the range of 45%   to 50%. Diffuse hypokinesis. - Ventricular septum: Septal motion showed abnormal function and   dyssynergy. - Mitral valve: Calcified annulus. Moderately thickened, moderately   calcified leaflets . There was moderate to severe regurgitation. - Left atrium: The atrium was moderately dilated. Volume/bsa, ES,   (1-plane Simpson&'s, A2C): 48.7 ml/m^2. - Right atrium: The atrium was mildly dilated. - Tricuspid valve: There was moderate regurgitation. - Pulmonary arteries: Systolic pressure was mildly increased. PA   peak pressure: 38 mm Hg (S).  Impressions:  - When compared to prior, mitral regurgitation has progressed and   EF has decreased.    Patient Profile     84 y.o. female with paroxysmal atrial fibrillation, hypertension, hyperlipidemia and medically managed NSTEMI 11/2017 here with chest pain, shortness of breath and elevated troponin.   Assessment & Plan    #  NSTEMI: Troponin was elevated to 4.23.  EKG shows LBBB that was previously noted.  She is not a candidate for intervention.  She was started on metoprolol 12.5mg  bid and Imdur 30mg  daily.  Lisinopril was reduced  to 5mg  and has now been held due to acute renal injury.  Continue aspirin and ticagrelor.  Heparin was stopped after 48 hours.  Echo revealed mild, diffuse hypokinesis with LVEF 45-50%.  She denies chest pain.  # Mitral regurgitation: Echo this admission revealed that her mitral regurgitation has increased to moderate to severe.  When last assessed 11/2017 it was reportedly mild.  Severity of mitral regurgitation can change based on the hemodynamic profile at the time.  On my independent review of her echo it appears that she has thickened mitral valve leaches leaflets and partial restriction of the posterior leaflet.  # Acute on chronic systolic and diastolic heart failure: Patient is still mildly volume overloaded but had worsened renal function with diuresis.  Recommend starting Lasix 20 mg oral daily tomorrow.  She will need a basic metabolic panel in 1 week.  # Paroxysmal atrial fibrillation:  She has been in and out of atrial fibrillation this admission.  Ms. Duclos is not anticoagulated due to a fall history.  She has been on dual antiplatelet therapy.  Metoprolol was started this admission.  On 1/24 she was given a dose of IV digoxin with significant improvement in her heart rate.  We will stop oral digoxin today.  She will take 0.375 mg this morning followed by 0.125 mg every 6 hours for 2 doses.  Starting tomorrow she was started on 0.125 mg daily.  Recommend checking a level next week when she gets her BMP.  This should be checked in the morning prior to taking digoxin.  # Hypertension: Holding lisinopril 2/2 AKI.  Metoprolol was started this admission.   # Dispo: Patient planning to go home on hospice today.  For questions or updates, please contact [facility] Please consult  [email or web address] for contact info under Cardiology/STEMI.      Signed, Chilton Si, MD  12/31/2017, 10:18 AM

## 2017-12-31 NOTE — Discharge Summary (Signed)
Physician Discharge Summary  [name]:[id number] DOB: [birth year]  PCP: Lavell Islam, MD  Admit date: 12/28/2017 Discharge date: 12/31/2017  Recommendations for Outpatient Follow-up:  1. Dr. Kern Reap at [facility] for ongoing hospice needs. 2. At the discretion of [facility] physician, may consider checking digoxin level and BMP in a week's time if felt appropriate.  Home Health: None Equipment/Devices: None  Discharge Condition: Guarded and at risk for decline including death.  Poor prognosis. CODE STATUS: DNR. Diet recommendation: Dysphagia 3 diet and thin liquids.  May transition to comfort feeds of choice and [facility].  Discharge Diagnoses:  Active Problems:   Essential hypertension   GERD with stricture   NSTEMI (non-ST elevation myocardial infarction) (HCC)   Healthcare-associated pneumonia   Infectious systemic inflammatory response syndrome (SIRS) (HCC)   HCAP (healthcare-associated pneumonia)   Palliative care encounter   Goals of care, counseling/discussion   Paroxysmal atrial fibrillation (HCC)   Acute combined systolic and diastolic heart failure (HCC)   Persistent atrial fibrillation (HCC)   Comfort measures only status   Brief Summary: 84 year old female, ALF resident, PMH of recent NSTEMI/medically managed, PAF not on anticoagulation due to history of falls (followed by Dr. Alonza Bogus with [facility]), HTN, HLD, GERD, presented to ED with chest pain, 1-2 days progressive dyspnea and recent cough.  Admitted for sepsis due to HCAP Vs aspiration pneumonia, decompensated CHF, lactic acidosis and elevated troponin.  Cardiology and palliative care consulted.   Assessment & Plan:   1. Sepsis due to possible pneumonia: Received IV fluids per sepsis protocol in ED but IV fluids then discontinued due to concern for decompensated CHF.  Empirically started on IV vancomycin, cefepime and Flagyl.  Blood cultures x 2, Neg to date, MRSA PCR negative.   Discussed with pharmacy 1/23 and discontinued vancomycin.  Repeat chest x-ray 1/24 suggestive more of CHF.  She received 3 days of IV cefepime.  Now transitioning to residential hospice, no antibiotics at discharge.  Remains at risk for recurrent aspiration pneumonia. 2. Suspected aspiration pneumonia versus HCAP:  Management as indicated above.  Speech therapy recommends dysphagia 3 diet and thin liquids. 3. Dysphagia: Speech therapy follow-up appreciated and diet as above. 4. 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  TTE 12/17: LVEF 50-55% and grade 1 diastolic dysfunction.  Chest x-ray showed persistent pulmonary edema. Repeat 2D echo: LVEF 45-50%, diffuse hypokinesis and moderate to severe MR.  Cardiology input appreciated and indicate overall poor prognosis and recommend palliative care consultation, who are following.  BNP 1284.8.    Lasix was stopped due to acute kidney injury.  She still is mildly volume overloaded and as per Cardiology recommendation, Lasix 20 mg daily beginning 1/26 to assist with CHF, dyspnea and comfort. 5. Elevated troponin/demand ischemia versus NSTEMI: Cardiology input appreciated.  Was on IV heparin drip, aspirin and Brillinta.  Repeat 2D echo, results as above.  Peak troponin 4.23.    IV heparin was stopped after 48 hours.  As discussed with cardiology today, reasonable to continue aspirin and Brilinta at discharge due to recent NSTEMI but also reasonable if hospice MD decides to discontinue all medications nonessential to comfort. 6. Lactic acidosis: Secondary to sepsis and decompensated CHF.  Improved, 6.4 > 2.8 7. Essential HTN: Controlled.  Low-dose metoprolol added.  Discontinued lisinopril due to acute kidney injury. 8. Hyperlipidemia:  Discontinued statins due to hospice.  9. PAF: Currently in A. fib with controlled ventricular rate.  Not anticoagulation candidate  secondary to falls.  Low-dose metoprolol added but remained uncontrolled.  Low-dose digoxin load started by cardiology and plan to continue digoxin 0.125 mg daily to assist with rate control, avoid palpitations and related discomfort. 10. Acute respiratory failure with hypoxia: Secondary to pneumonia and decompensated CHF.    Oxygen for comfort.  Morphine scheduled and as needed as per palliative care team for dyspnea and pain. 11. Acute kidney injury: Creatinine went up from 1.08 on 1/23-1.68, likely due to Lasix and lisinopril, hold both.    Creatinine has improved to 1.3. 12. GERD: Continue PPI. 13. Hyponatremia: Stable.   14. Adult failure to thrive: Multifactorial related to very advanced age, frail physical status and multiple severe significant medical comorbidities.  Palliative care input much appreciated, discussed with them.  After palliative care meeting with patient and son, they have opted for hospice.  Family were interested in residential hospice and will be transferred there. 15. DM 2, new: A1c 6.6. 16. Anemia: Hemoglobin is dropped from 12-10.3 in the absence of overt bleeding.  Follow CBC in a.m.   Consultants:  Cardiology Palliative care team  Procedures:  None       Discharge Instructions  Discharge Instructions    Call MD for:  difficulty breathing, headache or visual disturbances   Complete by:  As directed    Call MD for:  severe uncontrolled pain   Complete by:  As directed    Diet - low sodium heart healthy   Complete by:  As directed    Discharge instructions   Complete by:  As directed    Diet consistency: Dysphagia 3 diet and thin liquids.   Increase activity slowly   Complete by:  As directed        Medication List    STOP taking these medications   acetaminophen 500 MG tablet Commonly known as:  TYLENOL   atorvastatin 80 MG tablet Commonly known as:  LIPITOR   calcium carbonate 500 MG chewable tablet Commonly known as:  TUMS - dosed  in mg elemental calcium   co-enzyme Q-10 30 MG capsule   diclofenac sodium 1 % Gel Commonly known as:  VOLTAREN   guaiFENesin 600 MG 12 hr tablet Commonly known as:  MUCINEX   lisinopril 10 MG tablet Commonly known as:  PRINIVIL,ZESTRIL   loratadine 10 MG tablet Commonly known as:  CLARITIN   rosuvastatin 5 MG tablet Commonly known as:  CRESTOR   Vitamin D 2000 units Caps     TAKE these medications   aspirin 81 MG EC tablet Take 1 tablet (81 mg total) by mouth daily.   digoxin 0.125 MG tablet Commonly known as:  LANOXIN Take 1 tablet (0.125 mg total) by mouth daily. Start taking on:  01/01/2018   furosemide 20 MG tablet Commonly known as:  LASIX Take 1 tablet (20 mg total) by mouth daily. Start taking on:  01/01/2018   metoprolol tartrate 25 MG tablet Commonly known as:  LOPRESSOR Take 0.5 tablets (12.5 mg total) by mouth 2 (two) times daily.   morphine CONCENTRATE 10 MG/0.5ML Soln concentrated solution Take 0.13 mLs (2.6 mg total) by mouth every 6 (six) hours.   morphine CONCENTRATE 10 MG/0.5ML Soln concentrated solution Place 0.25 mLs (5 mg total) under the tongue every 4 (four) hours as needed for moderate pain or shortness of breath.   nitroGLYCERIN 0.4 MG SL tablet Commonly known as:  NITROSTAT  Place 0.4 mg under the tongue every 5 (five) minutes as needed for chest pain.   oxybutynin 5 MG tablet Commonly known as:  DITROPAN Take 5 mg by mouth 3 (three) times daily. For bladder   pantoprazole 40 MG tablet Commonly known as:  PROTONIX Take 1 tablet (40 mg total) by mouth daily.   senna 8.6 MG tablet Commonly known as:  SENOKOT Take 1 tablet by mouth daily.   SENNA PO Take 1 tablet by mouth daily as needed (constipation).   ticagrelor 90 MG Tabs tablet Commonly known as:  BRILINTA Take 1 tablet (90 mg total) by mouth 2 (two) times daily.      Follow-up Information    Kern Reap, MD Follow up.   Specialty:  Internal Medicine Why:  [place]  up at [facility] for hospice. Contact information: [address] [facility] [place] [address] [phone]        Cloward, Laban Emperor, MD Follow up.   Specialty:  Internal Medicine Why:  As needed. Contact information: [address] [place] [address] [phone]          Allergies  Allergen Reactions  . Amoxicillin Itching      Procedures/Studies: Dg Chest 2 View  Result Date: 12/30/2017 CLINICAL DATA:  Hypoxia EXAM: CHEST  2 VIEW COMPARISON:  December 29, 2017 FINDINGS: There is cardiomegaly with pulmonary venous hypertension. There are pleural effusions bilaterally with interstitial pulmonary edema. There is slightly less interstitial edema compared to 1 day prior. There is atelectatic change in lung bases. There is aortic atherosclerosis. There is degenerative change in the thoracic spine. IMPRESSION: Lungs felt to be indicative of congestive heart failure, stable. Note that there is slightly less interstitial edema compared to 1 day prior. Pleural effusions appear essentially stable. Aortic atherosclerosis. Bibasilar atelectasis. No new opacity evident. Electronically Signed   By: Bretta Bang III M.D.   On: 12/30/2017 13:44   X-ray Chest Pa And Lateral  Result Date: 12/29/2017 CLINICAL DATA:  Episodes of shortness of breath and wheezing today. No chest pain. History of previous MI. EXAM: CHEST  2 VIEW COMPARISON:  Chest x-ray of December 28, 2017 FINDINGS: There is interval increase in the density of the pulmonary interstitium consistent with interstitial edema. The cardiac silhouette is enlarged and its margins indistinct. The pulmonary vascularity is engorged and indistinct. There calcification in the wall of the aortic arch. There is pleural fluid layering posteriorly and inferiorly. There is multilevel degenerative disc disease of the thoracic spine. IMPRESSION: CHF with pulmonary interstitial edema. Moderate sized bilateral pleural  effusions layering posteriorly. One cannot exclude basilar pneumonia due to overlying pleural fluid. Electronically Signed   By: David  [place] M.D.   On: 12/29/2017 09:08   Dg Chest Port 1 View  Result Date: 12/28/2017 CLINICAL DATA:  Shortness of breath and chest pressure. EXAM: PORTABLE CHEST 1 VIEW COMPARISON:  Chest x-ray dated November 21, 2017. FINDINGS: Stable cardiomegaly. Normal pulmonary vascularity. Slightly, chronically coarsened interstitial markings are similar to prior study. Increased left retrocardiac and right medial basilar density. Probable small left pleural effusion. No pneumothorax. Unchanged old right-sided rib fractures. No acute osseous abnormality. IMPRESSION: 1. Increasing density at both lung bases may reflect atelectasis or aspiration/pneumonia. 2. New small left pleural effusion. Electronically Signed   By: Obie Dredge M.D.   On: 12/28/2017 12:31      Subjective: Seen early this morning.  Reports intermittent dyspnea but no chest pain.  Yesterday's events noted,  patient expressing desire to die.  Discharge Exam:  Vitals:   12/30/17 2119 12/31/17 0000 12/31/17 0356 12/31/17 1141  BP:  (!) 94/50 121/76 (!) 151/119  Pulse: 92 66 79 (!) 115  Resp:  13 (!) 22   Temp:   98.1 F (36.7 C)   TempSrc:   Oral   SpO2:  97% 98%   Weight:      Height:        General exam: Elderly female, small built and frail, sitting up comfortably in bed this morning, with mild intermittent DOE on speaking. Respiratory system: Bronchial breath sounds bibasilarly.  Occasional basal crackles.  Rest of lung fields clear to auscultation Cardiovascular system: S1 & S2 heard, RRR. No JVD, murmurs, rubs, gallops or clicks. No pedal edema.  Telemetry personally reviewed: A. fib with BBB morphology with rate in the 110s-120s this morning. Gastrointestinal system: Abdomen is nondistended, soft and nontender. No organomegaly or masses felt. Normal bowel sounds heard.  Central nervous  system: Alert and oriented x2. No focal neurological deficits.  Extremities: Symmetric 5 x 5 power. Skin: Chronic hyperpigmentation of bilateral legs.  Also multiple superficial bruising over lower extremities. Psychiatry: Judgement and insight impaired. Mood & affect appropriate.      The results of significant diagnostics from this hospitalization (including imaging, microbiology, ancillary and laboratory) are listed below for reference.     Microbiology: Recent Results (from the past 240 hour(s))  Blood Culture (routine x 2)     Status: None (Preliminary result)   Collection Time: 12/28/17 12:45 PM  Result Value Ref Range Status   Specimen Description BLOOD RIGHT ANTECUBITAL  Final   Special Requests   Final    BOTTLES DRAWN AEROBIC AND ANAEROBIC Blood Culture adequate volume   Culture NO GROWTH 3 DAYS  Final   Report Status PENDING  Incomplete  Blood Culture (routine x 2)     Status: None (Preliminary result)   Collection Time: 12/28/17 12:55 PM  Result Value Ref Range Status   Specimen Description BLOOD RIGHT HAND  Final   Special Requests   Final    BOTTLES DRAWN AEROBIC AND ANAEROBIC Blood Culture adequate volume   Culture NO GROWTH 3 DAYS  Final   Report Status PENDING  Incomplete  MRSA PCR Screening     Status: None   Collection Time: 12/28/17  7:04 PM  Result Value Ref Range Status   MRSA by PCR NEGATIVE NEGATIVE Final    Comment:        The GeneXpert MRSA Assay (FDA approved for NASAL specimens only), is one component of a comprehensive MRSA colonization surveillance program. It is not intended to diagnose MRSA infection nor to guide or monitor treatment for MRSA infections.      Labs: CBC: Recent Labs  Lab 12/28/17 1203 12/28/17 1546 12/29/17 0338 12/30/17 0203 12/31/17 0416  WBC 11.0*  --  14.3* 12.5* 11.1*  NEUTROABS 10.3*  --   --   --   --   HGB 11.7* 11.6* 12.0 10.3* 10.7*  HCT 35.8* 34.0* 36.3 31.9* 32.8*  MCV 85.4  --  84.8 85.3 85.4   PLT 363  --  410* 325 312   Basic Metabolic Panel: Recent Labs  Lab 12/28/17 1203 12/28/17 1546 12/29/17 0338 12/30/17 0203 12/31/17 0416  NA 133* 131* 130* 130* 133*  K 4.3 4.4 4.5 4.2 4.2  CL 97* 99* 100* 100* 100*  CO2 18*  --  17* 19* 22  GLUCOSE 230* 218* 204*  155* 102*  BUN 24* 22* 25* 42* 40*  CREATININE 1.33* 0.90 1.08* 1.68* 1.36*  CALCIUM 9.6  --  8.8* 8.7* 9.1   Liver Function Tests: Recent Labs  Lab 12/28/17 1203 12/29/17 0338  AST 44* 39  ALT 12* 14  ALKPHOS 80 82  BILITOT 0.7 0.8  PROT 6.1* 6.0*  ALBUMIN 3.0* 2.9*   BNP (last 3 results) Recent Labs    11/21/17 1138 12/28/17 2249  BNP 301.5* 1,284.8*   Cardiac Enzymes: Recent Labs  Lab 12/29/17 1548 12/29/17 1949 12/30/17 0203  TROPONINI 4.26* 3.79* 4.23*   CBG: Recent Labs  Lab 12/30/17 0618 12/30/17 1107 12/30/17 1623 12/30/17 2219 12/31/17 0623  GLUCAP 158* 170* 348* 78 105*   Hgb A1c Recent Labs    12/29/17 1548  HGBA1C 6.6*   Lipid Profile No results for input(s): CHOL, HDL, LDLCALC, TRIG, CHOLHDL, LDLDIRECT in the last 72 hours. Thyroid function studies Recent Labs    12/29/17 0338  TSH 1.225   Anemia work up No results for input(s): VITAMINB12, FOLATE, FERRITIN, TIBC, IRON, RETICCTPCT in the last 72 hours. Urinalysis    Component Value Date/Time   COLORURINE YELLOW 12/30/2017 0815   APPEARANCEUR CLEAR 12/30/2017 0815   LABSPEC 1.011 12/30/2017 0815   PHURINE 5.0 12/30/2017 0815   GLUCOSEU NEGATIVE 12/30/2017 0815   HGBUR NEGATIVE 12/30/2017 0815   BILIRUBINUR NEGATIVE 12/30/2017 0815   KETONESUR NEGATIVE 12/30/2017 0815   PROTEINUR NEGATIVE 12/30/2017 0815   UROBILINOGEN 0.2 01/18/2010 0909   NITRITE NEGATIVE 12/30/2017 0815   LEUKOCYTESUR NEGATIVE 12/30/2017 0815      Time coordinating discharge: Over 30 minutes  SIGNED:  Marcellus Scott, MD, FACP, [facility]. [facility] Pager [phone]  If 7PM-7AM, please contact  night-coverage [email or web address] Password [facility] 12/31/2017, 3:06 PM

## 2017-12-31 NOTE — Progress Notes (Signed)
Nutrition Brief Note  Pt identified on the Malnutrition Screening Tool report. Pt now transitioning to comfort care.  No nutrition interventions warranted at this time.  Please consult as needed.   Maureen Chatters, RD, LDN Pager #: [phone] After-Hours Pager #: [phone]

## 2018-01-02 LAB — CULTURE, BLOOD (ROUTINE X 2)
CULTURE: NO GROWTH
Culture: NO GROWTH
Special Requests: ADEQUATE
Special Requests: ADEQUATE

## 2018-01-07 DEATH — deceased

## 2018-03-15 IMAGING — DX DG CHEST 1V PORT
1 series · 1 of 1 positions shown · non-contrast
Comparison: Chest x-ray dated November 21, 2017.

CLINICAL DATA: Shortness of breath and chest pressure.

EXAM:
PORTABLE CHEST 1 VIEW

[chest ap]
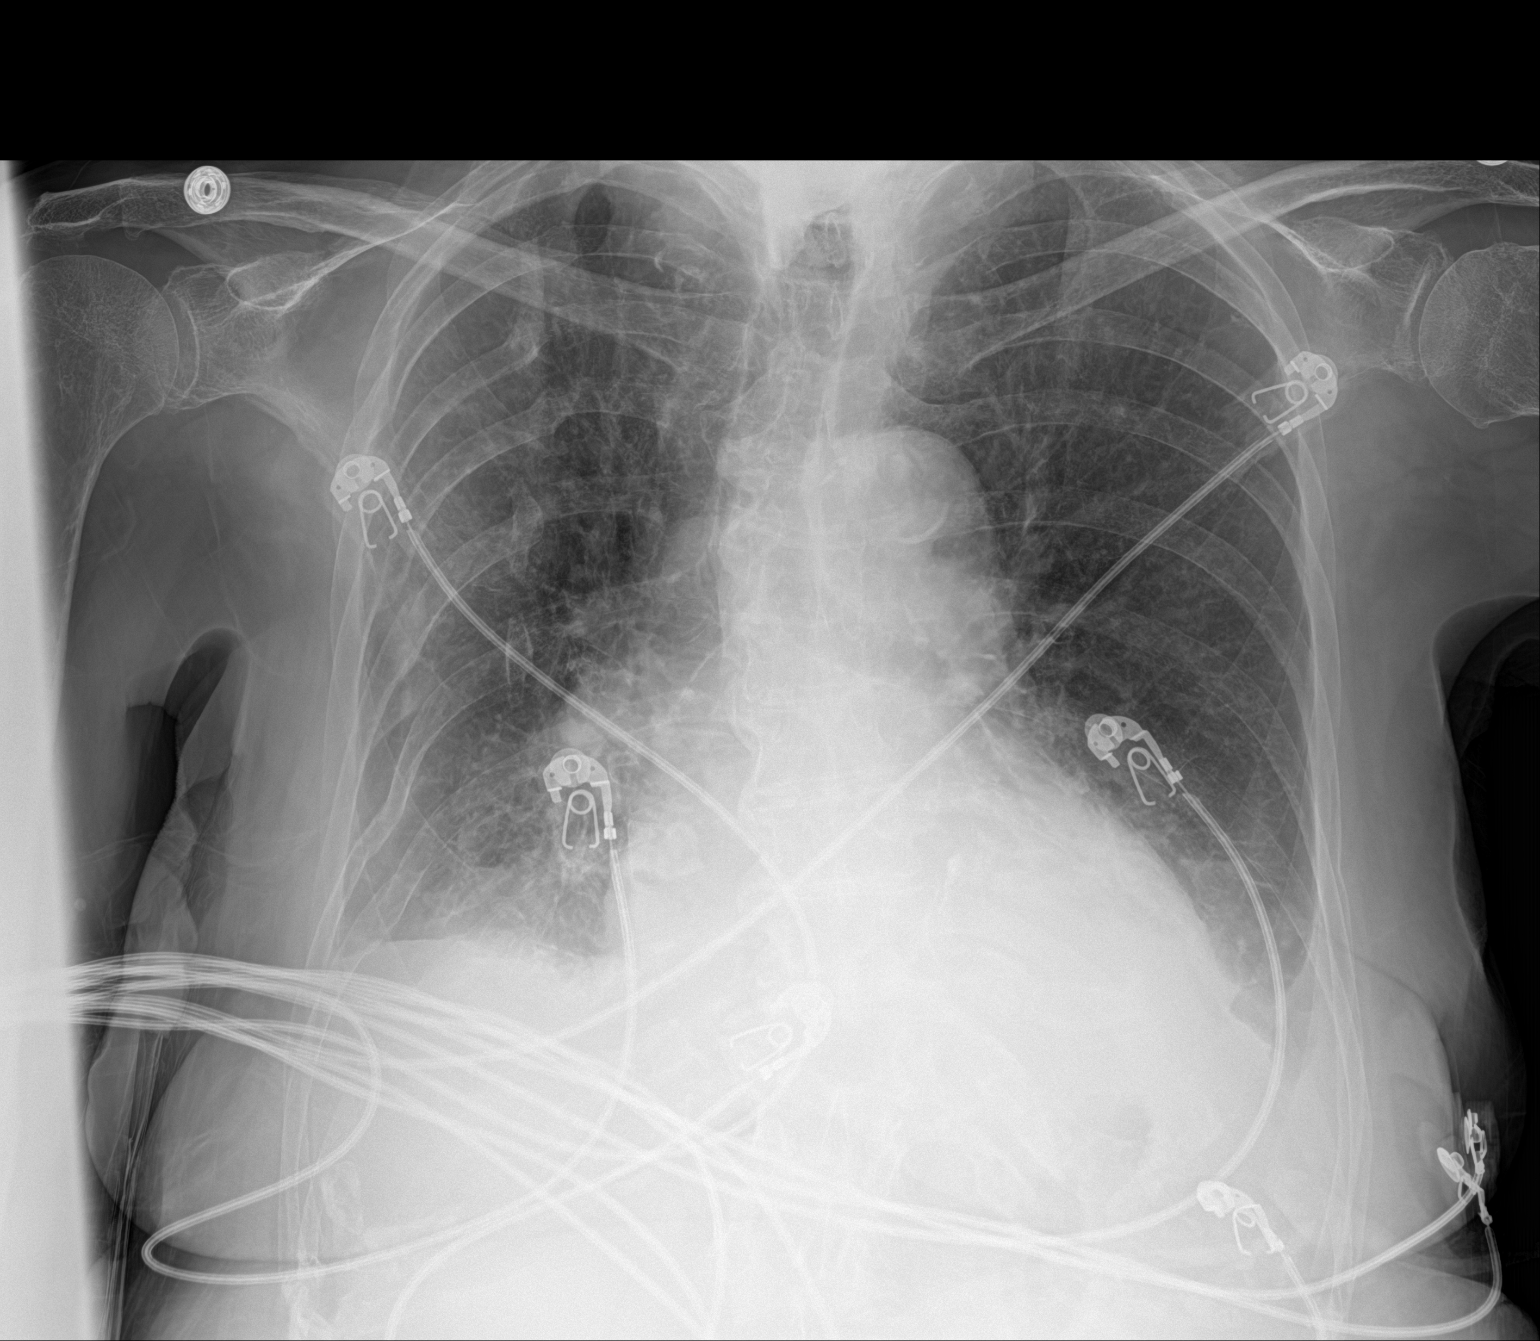

[1 of 1 positions shown; findings below may reference images not displayed]

FINDINGS: Stable cardiomegaly. Normal pulmonary vascularity. Slightly,
chronically coarsened interstitial markings are similar to prior
study. Increased left retrocardiac and right medial basilar density.
Probable small left pleural effusion. No pneumothorax. Unchanged old
right-sided rib fractures. No acute osseous abnormality.
IMPRESSION: 1. Increasing density at both lung bases may reflect atelectasis or
aspiration/pneumonia.
2. New small left pleural effusion.

## 2018-03-16 IMAGING — CR DG CHEST 2V
2 series · 2 of 2 positions shown · non-contrast
Comparison: Chest x-ray of December 28, 2017

CLINICAL DATA: Episodes of shortness of breath and wheezing today.
No chest pain. History of previous MI.

EXAM:
CHEST  2 VIEW

[chest pa]
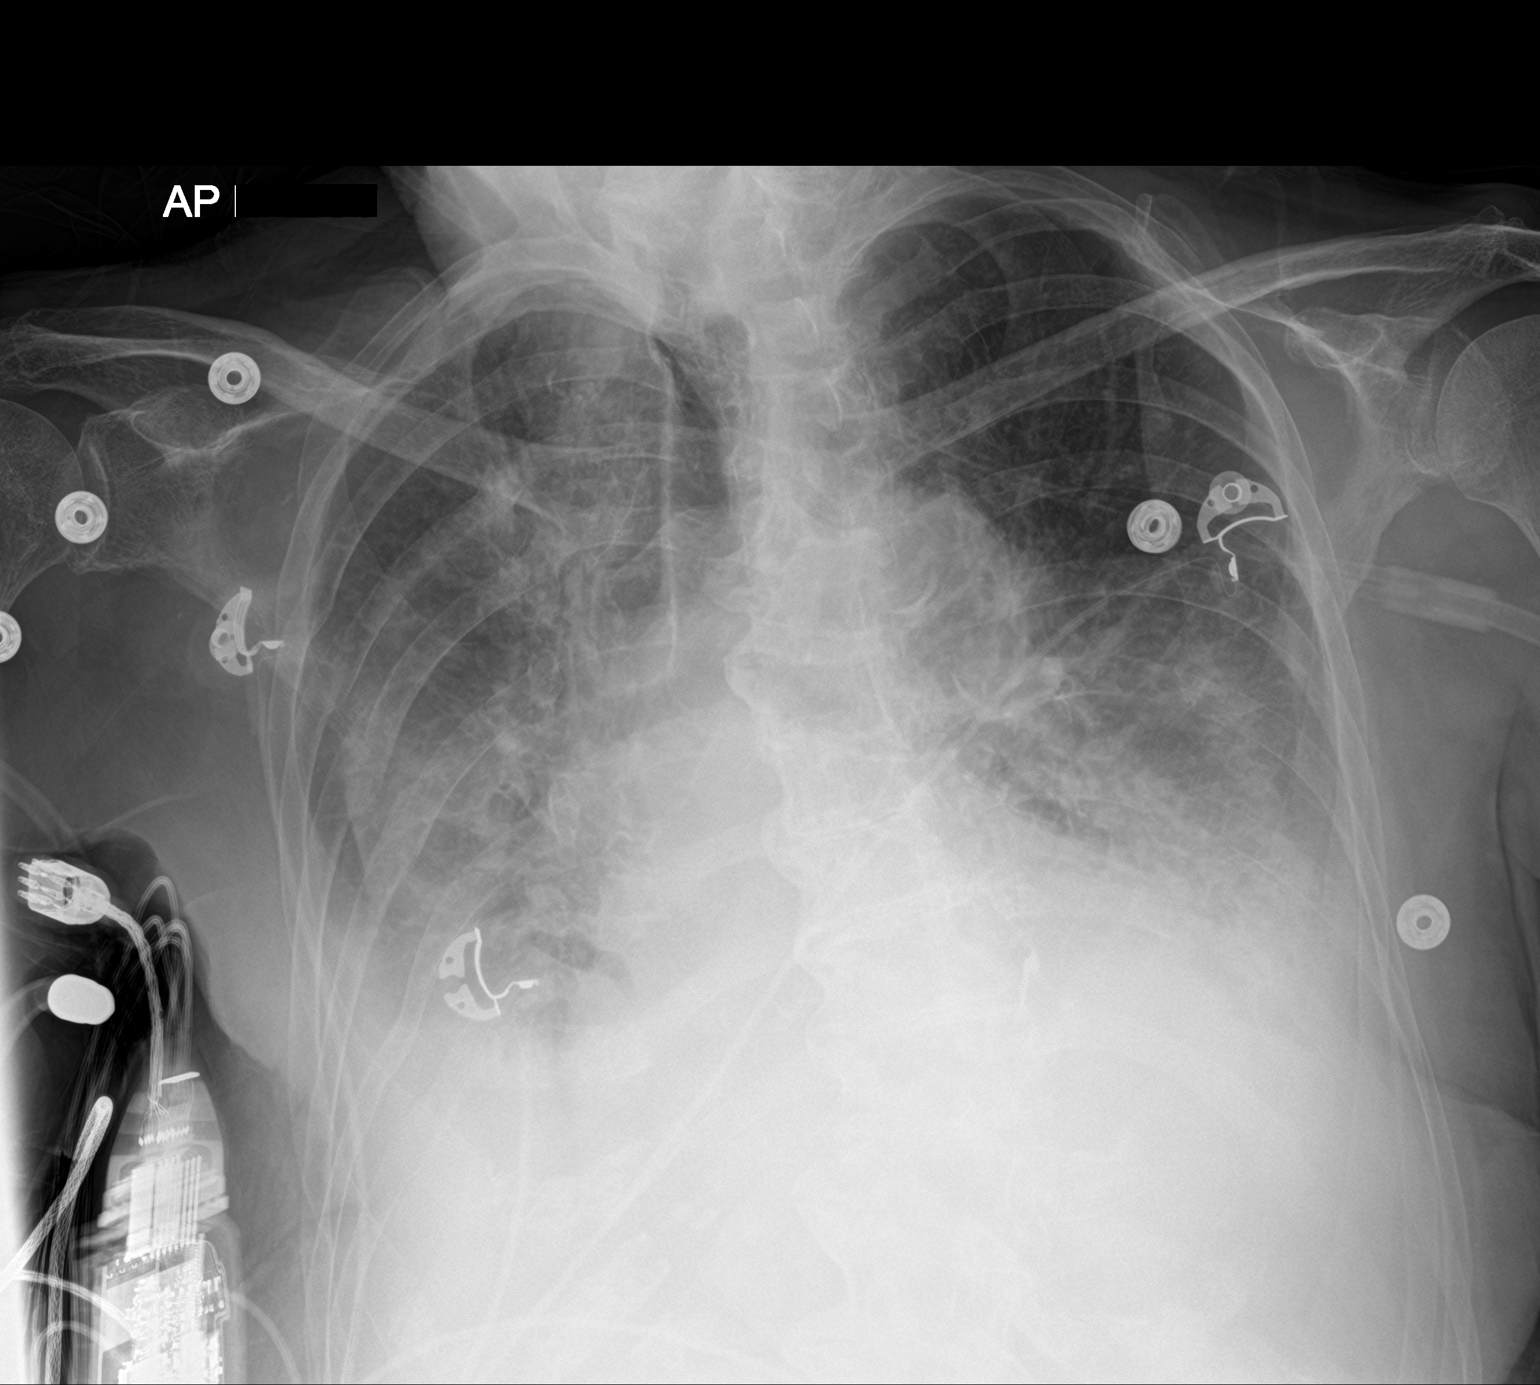

[chest lat]
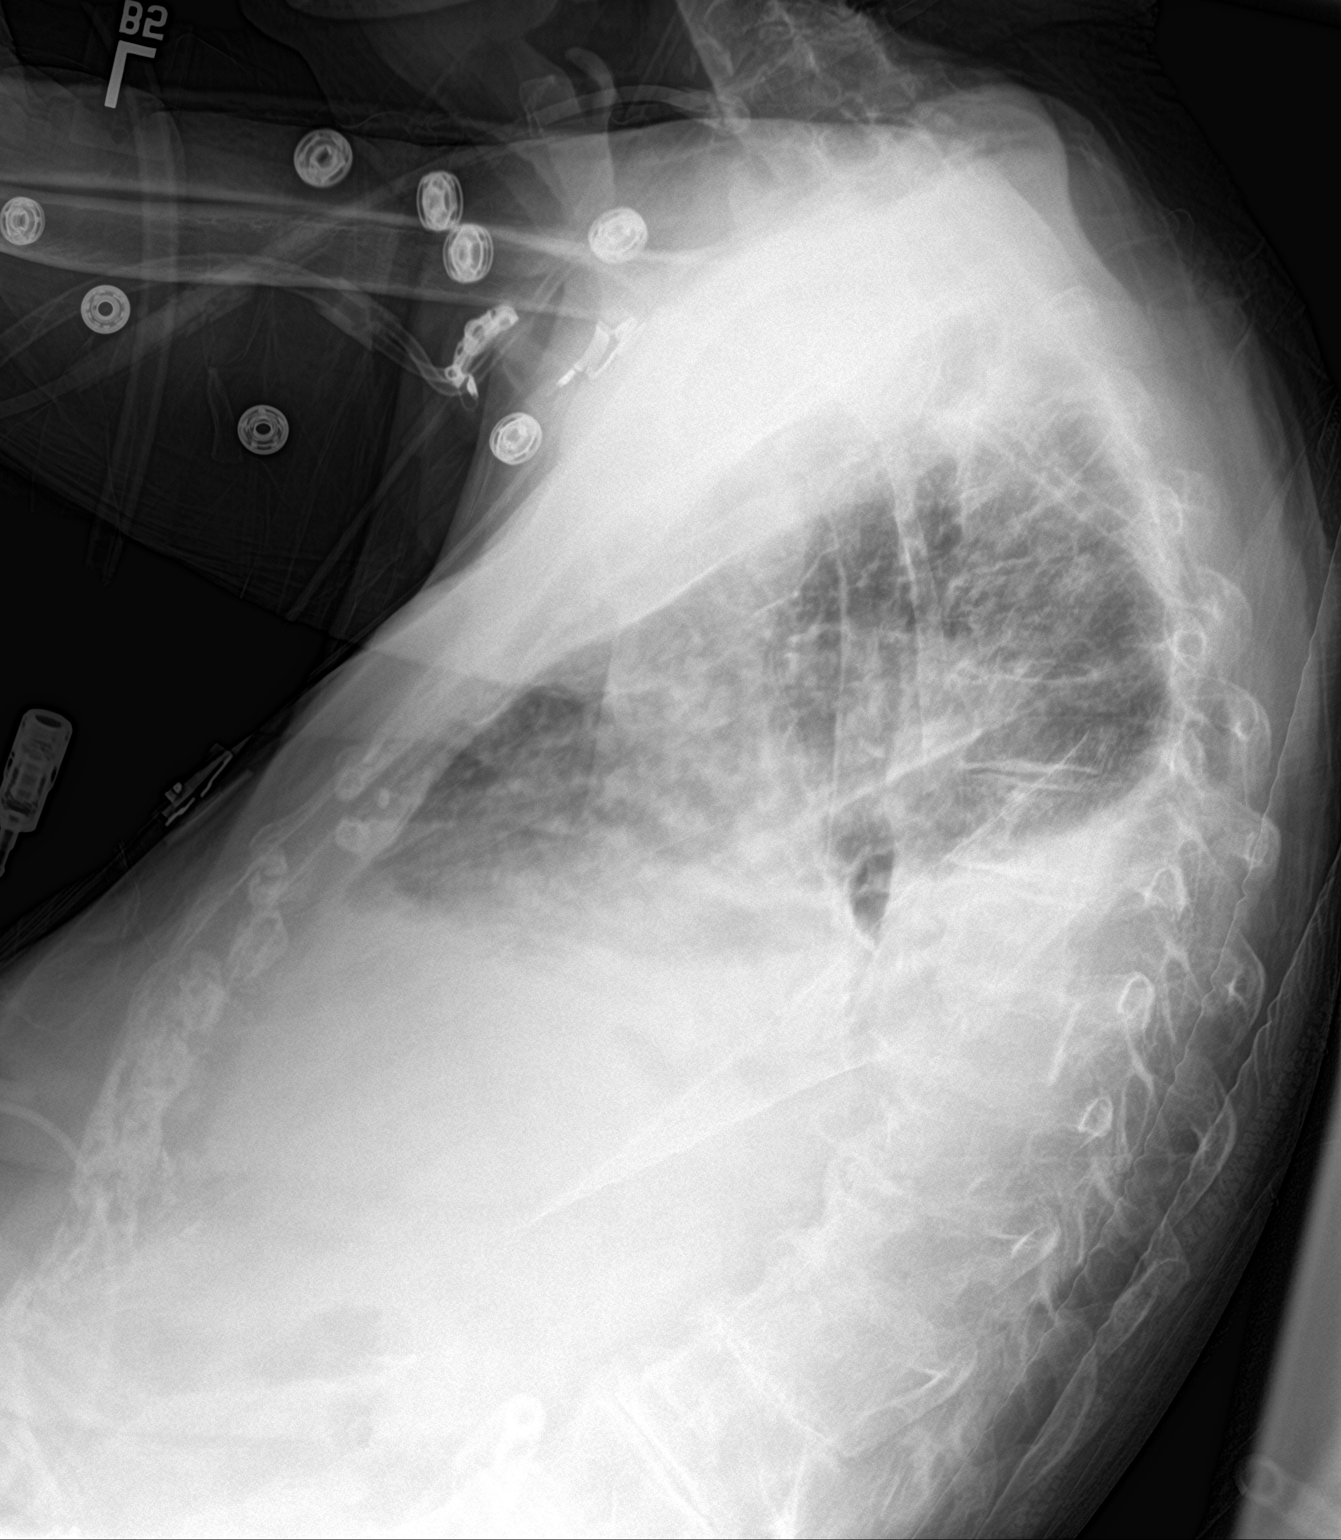

[2 of 2 positions shown; findings below may reference images not displayed]

FINDINGS: There is interval increase in the density of the pulmonary
interstitium consistent with interstitial edema. The cardiac
silhouette is enlarged and its margins indistinct. The pulmonary
vascularity is engorged and indistinct. There calcification in the
wall of the aortic arch. There is pleural fluid layering posteriorly
and inferiorly. There is multilevel degenerative disc disease of the
thoracic spine.
IMPRESSION: CHF with pulmonary interstitial edema. Moderate sized bilateral
pleural effusions layering posteriorly. One cannot exclude basilar
pneumonia due to overlying pleural fluid.

## 2018-03-17 IMAGING — DX DG CHEST 2V
2 series · 2 of 2 positions shown · non-contrast
Comparison: December 29, 2017

CLINICAL DATA: Hypoxia

EXAM:
CHEST  2 VIEW

[chest lat]
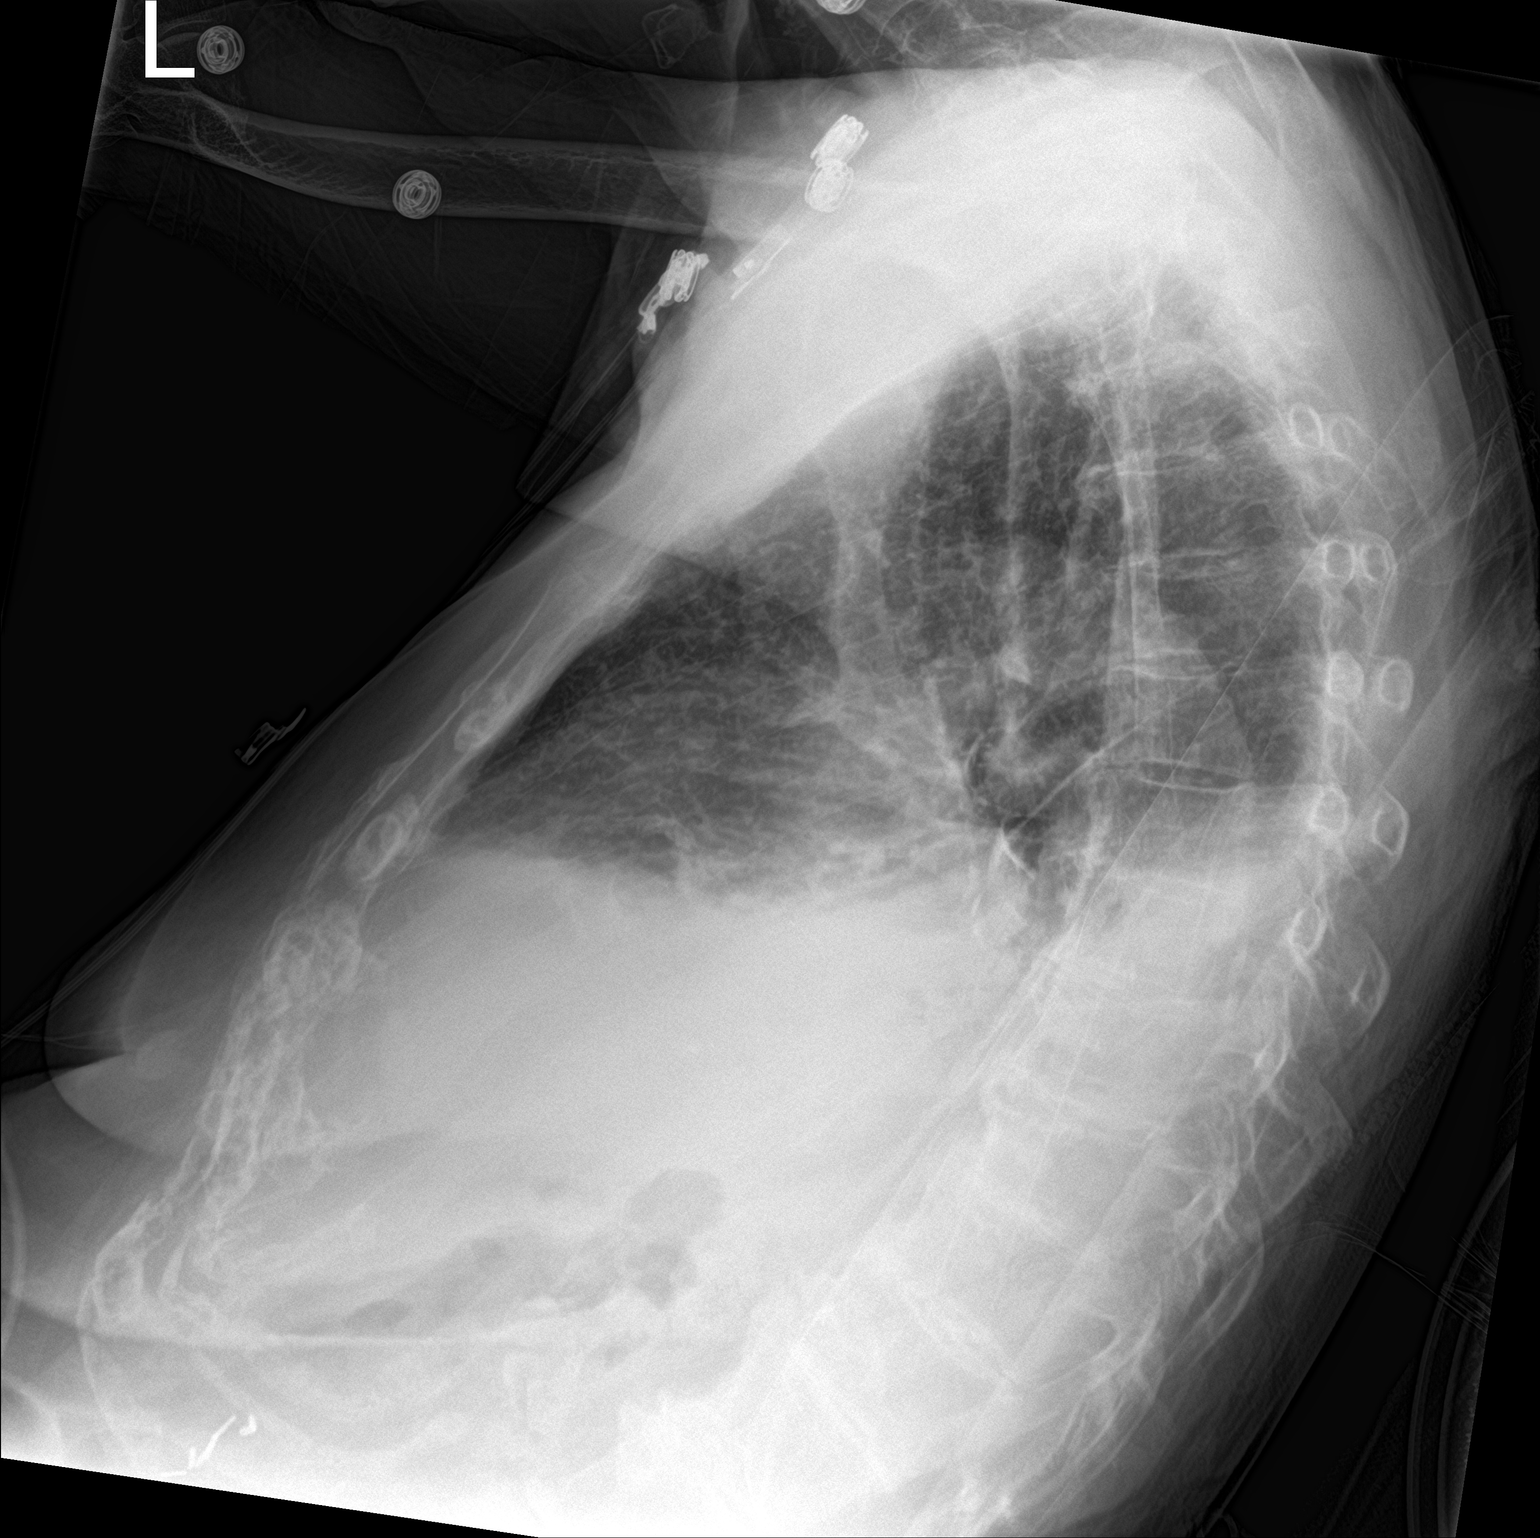

[chest ap]
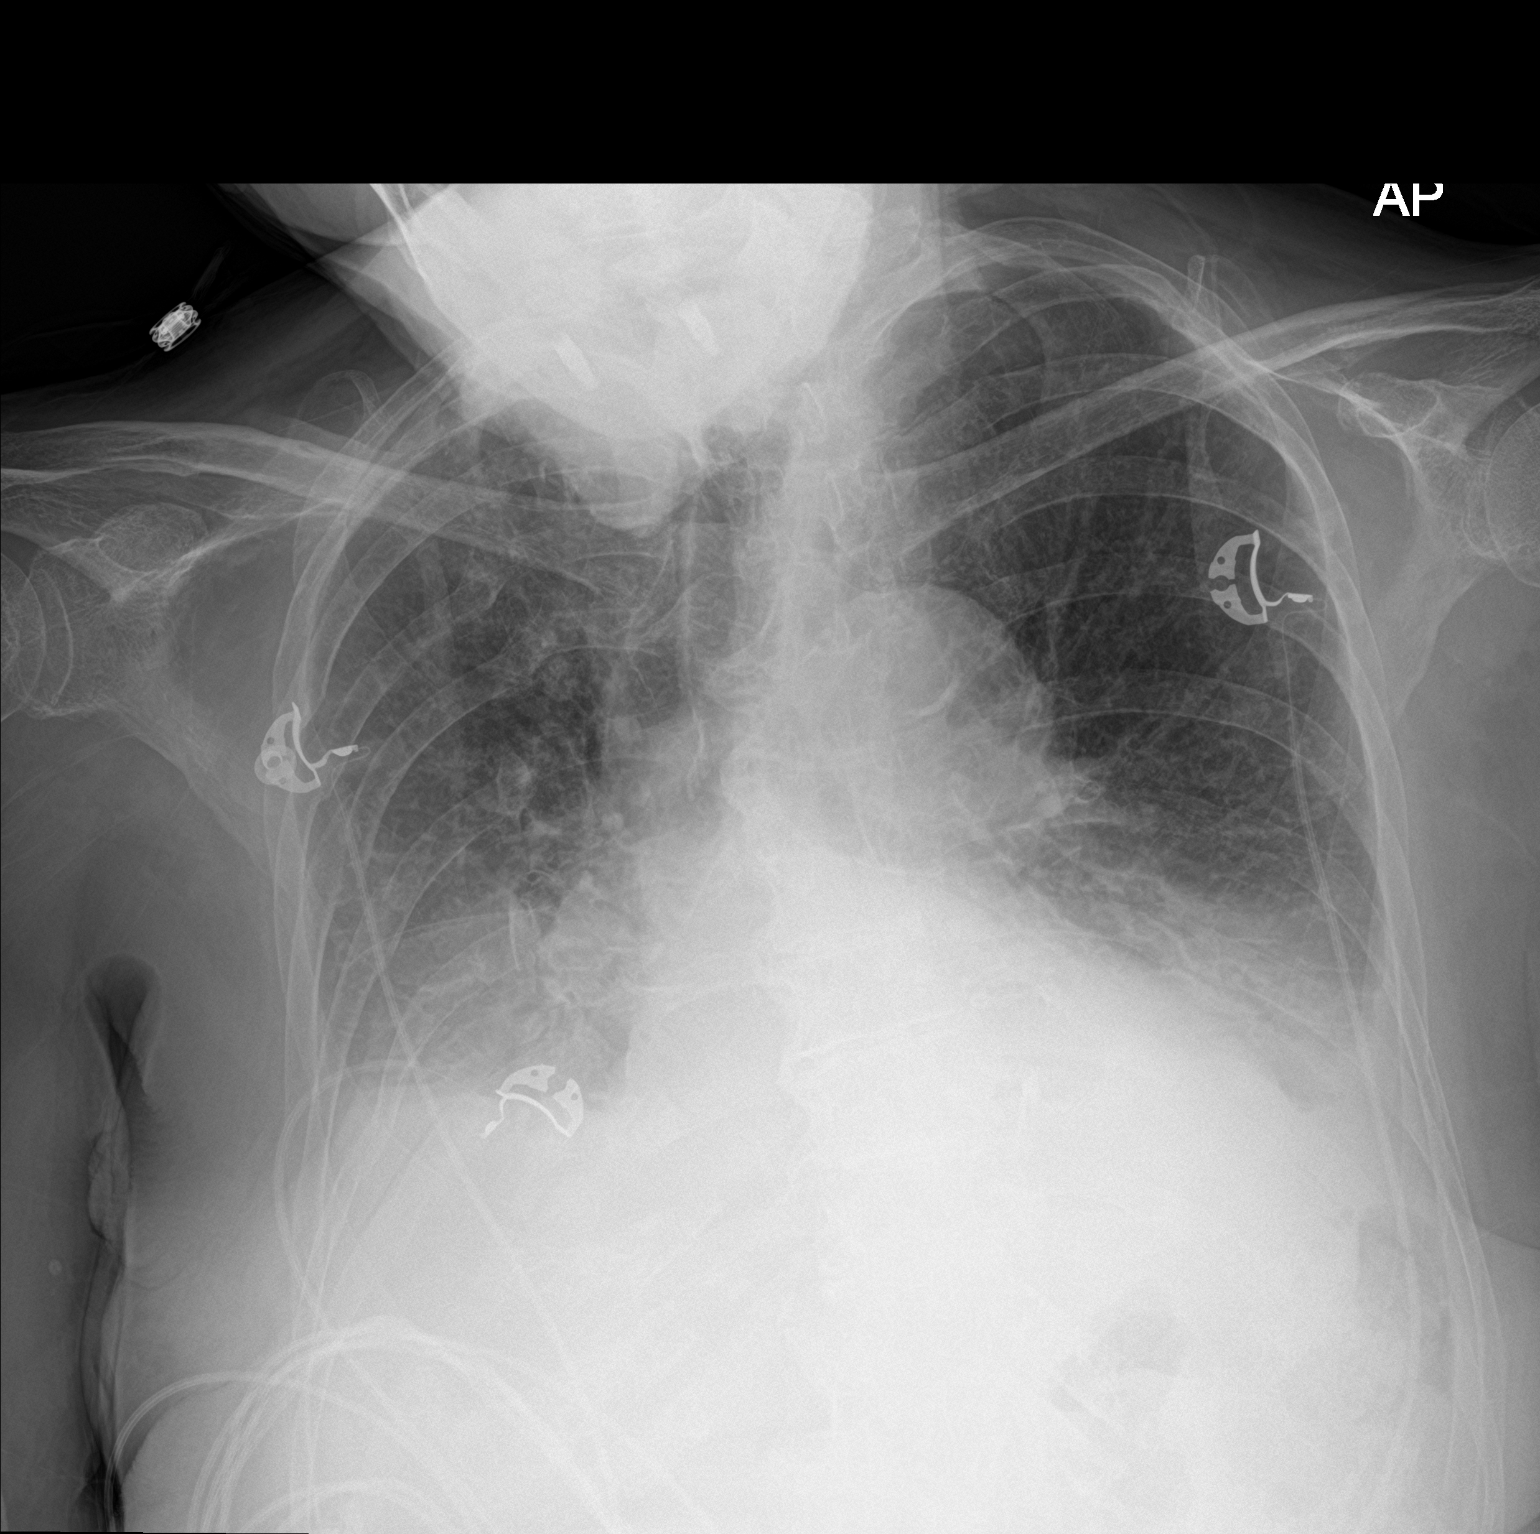

[2 of 2 positions shown; findings below may reference images not displayed]

FINDINGS: There is cardiomegaly with pulmonary venous hypertension. There are
pleural effusions bilaterally with interstitial pulmonary edema.
There is slightly less interstitial edema compared to 1 day prior.
There is atelectatic change in lung bases. There is aortic
atherosclerosis. There is degenerative change in the thoracic spine.
IMPRESSION: Lungs felt to be indicative of congestive heart failure, stable.
Note that there is slightly less interstitial edema compared to 1
day prior. Pleural effusions appear essentially stable. Aortic
atherosclerosis. Bibasilar atelectasis. No new opacity evident.
